# Patient Record
Sex: Male | Born: 1935 | Race: White | Hispanic: No | Smoking: Never smoker
Health system: Southern US, Community
[De-identification: ages and names within clinical notes are randomized; demographics above are authoritative.]

## PROBLEM LIST (undated history)

## (undated) DIAGNOSIS — E119 Type 2 diabetes mellitus without complications: Secondary | ICD-10-CM

## (undated) DIAGNOSIS — H3320 Serous retinal detachment, unspecified eye: Secondary | ICD-10-CM

## (undated) DIAGNOSIS — I214 Non-ST elevation (NSTEMI) myocardial infarction: Secondary | ICD-10-CM

## (undated) DIAGNOSIS — S2239XA Fracture of one rib, unspecified side, initial encounter for closed fracture: Secondary | ICD-10-CM

## (undated) DIAGNOSIS — C801 Malignant (primary) neoplasm, unspecified: Secondary | ICD-10-CM

## (undated) DIAGNOSIS — H269 Unspecified cataract: Secondary | ICD-10-CM

## (undated) DIAGNOSIS — J189 Pneumonia, unspecified organism: Secondary | ICD-10-CM

## (undated) DIAGNOSIS — H409 Unspecified glaucoma: Secondary | ICD-10-CM

## (undated) DIAGNOSIS — I10 Essential (primary) hypertension: Secondary | ICD-10-CM

## (undated) HISTORY — DX: Unspecified cataract: H26.9

## (undated) HISTORY — PX: EYE SURGERY: SHX253

## (undated) HISTORY — DX: Type 2 diabetes mellitus without complications: E11.9

## (undated) HISTORY — PX: JOINT REPLACEMENT: SHX530

## (undated) HISTORY — DX: Malignant (primary) neoplasm, unspecified: C80.1

## (undated) HISTORY — PX: PROSTATE SURGERY: SHX751

## (undated) HISTORY — DX: Unspecified glaucoma: H40.9

## (undated) HISTORY — PX: HERNIA REPAIR: SHX51

---

## 1998-10-04 ENCOUNTER — Other Ambulatory Visit: Admission: RE | Admit: 1998-10-04 | Discharge: 1998-10-04 | Payer: Self-pay | Admitting: Otolaryngology

## 2006-10-22 ENCOUNTER — Ambulatory Visit (HOSPITAL_COMMUNITY): Admission: RE | Admit: 2006-10-22 | Discharge: 2006-10-22 | Payer: Self-pay | Admitting: Ophthalmology

## 2007-07-16 ENCOUNTER — Emergency Department (HOSPITAL_COMMUNITY): Admission: EM | Admit: 2007-07-16 | Discharge: 2007-07-16 | Payer: Self-pay | Admitting: Emergency Medicine

## 2007-07-20 ENCOUNTER — Emergency Department (HOSPITAL_COMMUNITY): Admission: EM | Admit: 2007-07-20 | Discharge: 2007-07-20 | Payer: Self-pay | Admitting: Emergency Medicine

## 2010-04-12 ENCOUNTER — Emergency Department (HOSPITAL_COMMUNITY)
Admission: EM | Admit: 2010-04-12 | Discharge: 2010-04-13 | Payer: Self-pay | Source: Home / Self Care | Admitting: Emergency Medicine

## 2010-04-13 ENCOUNTER — Emergency Department (HOSPITAL_COMMUNITY)
Admission: EM | Admit: 2010-04-13 | Discharge: 2010-04-13 | Payer: Self-pay | Source: Home / Self Care | Admitting: Emergency Medicine

## 2010-04-14 ENCOUNTER — Emergency Department (HOSPITAL_COMMUNITY)
Admission: EM | Admit: 2010-04-14 | Discharge: 2010-04-15 | Payer: Self-pay | Source: Home / Self Care | Admitting: Emergency Medicine

## 2010-06-09 ENCOUNTER — Ambulatory Visit (HOSPITAL_COMMUNITY): Admission: RE | Admit: 2010-06-09 | Discharge: 2010-06-09 | Payer: Self-pay | Admitting: Ophthalmology

## 2010-07-23 ENCOUNTER — Ambulatory Visit (HOSPITAL_COMMUNITY): Admission: RE | Admit: 2010-07-23 | Discharge: 2010-07-23 | Payer: Self-pay | Admitting: Ophthalmology

## 2011-03-08 LAB — BASIC METABOLIC PANEL
BUN: 18 mg/dL (ref 6–23)
Chloride: 106 mEq/L (ref 96–112)
GFR calc non Af Amer: >60 mL/min (ref >60)
Glucose, Bld: 125 mg/dL — ABNORMAL HIGH (ref 70–99)
Potassium: 4.8 mEq/L (ref 3.5–5.1)
Sodium: 139 mEq/L (ref 135–145)

## 2011-03-08 LAB — SURGICAL PCR SCREEN: MRSA, PCR: NEGATIVE

## 2011-03-08 LAB — CBC
HCT: 48.3 % (ref 39.0–52.0)
Hemoglobin: 16.8 g/dL (ref 13.0–17.0)
MCV: 95.1 fL (ref 78.0–100.0)
RDW: 13 % (ref 11.5–15.5)
WBC: 8.1 10*3/uL (ref 4.0–10.5)

## 2011-03-08 LAB — GLUCOSE, CAPILLARY
Glucose-Capillary: 114 mg/dL — ABNORMAL HIGH (ref 70–99)
Glucose-Capillary: 119 mg/dL — ABNORMAL HIGH (ref 70–99)

## 2011-03-10 LAB — BASIC METABOLIC PANEL
BUN: 25 mg/dL — ABNORMAL HIGH (ref 6–23)
Chloride: 100 mEq/L (ref 96–112)
GFR calc non Af Amer: >60 mL/min (ref >60)
Potassium: 4.3 mEq/L (ref 3.5–5.1)
Sodium: 133 mEq/L — ABNORMAL LOW (ref 135–145)

## 2011-03-10 LAB — CBC
HCT: 49.1 % (ref 39.0–52.0)
Hemoglobin: 16.7 g/dL (ref 13.0–17.0)
MCV: 98.6 fL (ref 78.0–100.0)
RBC: 4.98 MIL/uL (ref 4.22–5.81)
WBC: 7.2 10*3/uL (ref 4.0–10.5)

## 2011-03-12 LAB — URINE CULTURE
Colony Count: NO GROWTH
Colony Count: NO GROWTH
Culture: NO GROWTH
Culture: NO GROWTH

## 2011-03-12 LAB — URINALYSIS, ROUTINE W REFLEX MICROSCOPIC
Nitrite: NEGATIVE
Protein, ur: 100 mg/dL — AB
Specific Gravity, Urine: 1.015 (ref 1.005–1.030)
Urobilinogen, UA: 0.2 mg/dL (ref 0.0–1.0)

## 2011-03-12 LAB — URINE MICROSCOPIC-ADD ON

## 2011-03-12 LAB — POCT I-STAT, CHEM 8
BUN: 21 mg/dL (ref 6–23)
Calcium, Ion: 1.1 mmol/L — ABNORMAL LOW (ref 1.12–1.32)
Calcium, Ion: 1.21 mmol/L (ref 1.12–1.32)
Chloride: 98 meq/L (ref 96–112)
Creatinine, Ser: 1 mg/dL (ref 0.4–1.5)
Glucose, Bld: 158 mg/dL — ABNORMAL HIGH (ref 70–99)
Glucose, Bld: 182 mg/dL — ABNORMAL HIGH (ref 70–99)
HCT: 45 % (ref 39.0–52.0)
HCT: 46 % (ref 39.0–52.0)
Hemoglobin: 15.3 g/dL (ref 13.0–17.0)
Hemoglobin: 15.6 g/dL (ref 13.0–17.0)
Potassium: 4 mEq/L (ref 3.5–5.1)
Potassium: 4.6 mEq/L (ref 3.5–5.1)
Sodium: 132 mEq/L — ABNORMAL LOW (ref 135–145)
TCO2: 25 mmol/L (ref 0–100)

## 2011-05-10 NOTE — Op Note (Signed)
Geoffrey Bennett, Geoffrey Bennett                ACCOUNT NO.:  [id number]   MEDICAL RECORD NO.:  [id number]          PATIENT TYPE:  AMB   LOCATION:  SDS                          FACILITY:  [facility]   PHYSICIAN:  Alford Highland. Rankin, M.D.   DATE OF BIRTH:  [birth year]   DATE OF PROCEDURE:  10/22/2006  DATE OF DISCHARGE:  10/22/2006                                 OPERATIVE REPORT   PREOPERATIVE DIAGNOSIS:  Epiretinal membrane, left eye.   POSTOPERATIVE DIAGNOSIS:  Epiretinal membrane, left eye.   PROCEDURE:  Posterior vitrectomy, membrane peel--25-gauge--epiretinal  membrane and internal limiting membrane, left eye.   SURGEON:  Fawn Kirk, MD   ANESTHESIA:  Local retrobulbar with monitored anesthesia control.   INDICATIONS FOR PROCEDURE:  The patient is an 77 year old man who has  significant visual impairment, visual dysfunction on the left on the basis  of epiretinal membrane and macular topographic distortion.  The patient says  this is an attempt to remove the macular epiretinal tissue and allow for the  chance of improving it visually.  The patient understands the risks of  anesthesia including the rare occurrence of death and loss of the eye,  including but not limited to hemorrhage, infection, scarring, need for  further surgery, no change in vision, loss of vision, progressive disease  despite of intervention.  Appropriate consent was obtained.   The patient was taken to the operating room.  In the operating room,  appropriate monitors followed by mild sedation.  Marcaine 0.75% delivered 5  cc retrobulbar followed by additional 5 cc laterally in a fashion of a  modified Darel Hong.  The left periocular region was sterilely prepped and  drape in the usual ophthalmic fashion.  The lid speculum applied.  A 25-  gauge trocar system was used to introduce the infusion.  Superior trocars  were applied.  Core vitrectomy was then begun.  Vitreous skirt was then  trimmed 360 degrees.  A 25-gauge forceps  was then used to engage the  epiretinal membrane, which came off as a single continuous sheet, and a  circumferential tear pattern off essentially the entire posterior pole  within the retinal vascular cage temporarily.  No complications occurred.  The peripheral retina was inspected and found to be free of holes and tears.  Vitreous at the sites of the superior trocars was trimmed so as to prevent  its movement externally.   At this time the superior trocars were removed and the wounds were  watertight and secured.  The infusion systems were removed.  At this time,  subconjunctival Decadron applied.  A sterile patch and Fox shield applied.  The patient taken to the discharge area and discharge home as an outpatient.      Alford Highland Rankin, M.D.  Electronically Signed    GAR/MEDQ  D:  10/22/2006  T:  10/23/2006  Job:  [id number]

## 2011-08-01 ENCOUNTER — Encounter: Payer: Self-pay | Admitting: Thoracic Surgery (Cardiothoracic Vascular Surgery)

## 2012-01-06 DIAGNOSIS — C61 Malignant neoplasm of prostate: Secondary | ICD-10-CM | POA: Diagnosis not present

## 2012-01-09 DIAGNOSIS — R972 Elevated prostate specific antigen [PSA]: Secondary | ICD-10-CM | POA: Diagnosis not present

## 2012-01-09 DIAGNOSIS — N281 Cyst of kidney, acquired: Secondary | ICD-10-CM | POA: Diagnosis not present

## 2012-01-09 DIAGNOSIS — C61 Malignant neoplasm of prostate: Secondary | ICD-10-CM | POA: Diagnosis not present

## 2012-01-09 DIAGNOSIS — R350 Frequency of micturition: Secondary | ICD-10-CM | POA: Diagnosis not present

## 2012-01-16 DIAGNOSIS — E119 Type 2 diabetes mellitus without complications: Secondary | ICD-10-CM | POA: Diagnosis not present

## 2012-01-16 DIAGNOSIS — E7800 Pure hypercholesterolemia, unspecified: Secondary | ICD-10-CM | POA: Diagnosis not present

## 2012-01-21 DIAGNOSIS — E7800 Pure hypercholesterolemia, unspecified: Secondary | ICD-10-CM | POA: Diagnosis not present

## 2012-01-21 DIAGNOSIS — I10 Essential (primary) hypertension: Secondary | ICD-10-CM | POA: Diagnosis not present

## 2012-01-21 DIAGNOSIS — E119 Type 2 diabetes mellitus without complications: Secondary | ICD-10-CM | POA: Diagnosis not present

## 2012-04-14 DIAGNOSIS — H33059 Total retinal detachment, unspecified eye: Secondary | ICD-10-CM | POA: Diagnosis not present

## 2012-04-14 DIAGNOSIS — H3340 Traction detachment of retina, unspecified eye: Secondary | ICD-10-CM | POA: Diagnosis not present

## 2012-04-14 DIAGNOSIS — H4060X0 Glaucoma secondary to drugs, unspecified eye, stage unspecified: Secondary | ICD-10-CM | POA: Diagnosis not present

## 2012-04-14 DIAGNOSIS — H35379 Puckering of macula, unspecified eye: Secondary | ICD-10-CM | POA: Diagnosis not present

## 2012-04-14 DIAGNOSIS — T380X5A Adverse effect of glucocorticoids and synthetic analogues, initial encounter: Secondary | ICD-10-CM | POA: Diagnosis not present

## 2012-04-21 DIAGNOSIS — E119 Type 2 diabetes mellitus without complications: Secondary | ICD-10-CM | POA: Diagnosis not present

## 2012-04-23 DIAGNOSIS — E119 Type 2 diabetes mellitus without complications: Secondary | ICD-10-CM | POA: Diagnosis not present

## 2012-04-23 DIAGNOSIS — E7800 Pure hypercholesterolemia, unspecified: Secondary | ICD-10-CM | POA: Diagnosis not present

## 2012-04-23 DIAGNOSIS — I10 Essential (primary) hypertension: Secondary | ICD-10-CM | POA: Diagnosis not present

## 2012-06-04 ENCOUNTER — Other Ambulatory Visit (HOSPITAL_COMMUNITY): Payer: Self-pay | Admitting: Urology

## 2012-06-04 DIAGNOSIS — N281 Cyst of kidney, acquired: Secondary | ICD-10-CM

## 2012-07-03 ENCOUNTER — Ambulatory Visit (HOSPITAL_COMMUNITY)
Admission: RE | Admit: 2012-07-03 | Discharge: 2012-07-03 | Disposition: A | Discharge status: Home / Self Care | Payer: Medicare Other | Source: Ambulatory Visit | Attending: Urology | Admitting: Urology

## 2012-07-03 DIAGNOSIS — Q619 Cystic kidney disease, unspecified: Secondary | ICD-10-CM | POA: Insufficient documentation

## 2012-07-03 DIAGNOSIS — C61 Malignant neoplasm of prostate: Secondary | ICD-10-CM | POA: Diagnosis not present

## 2012-07-03 DIAGNOSIS — N281 Cyst of kidney, acquired: Secondary | ICD-10-CM | POA: Diagnosis not present

## 2012-07-03 DIAGNOSIS — K8020 Calculus of gallbladder without cholecystitis without obstruction: Secondary | ICD-10-CM | POA: Diagnosis not present

## 2012-07-10 DIAGNOSIS — C61 Malignant neoplasm of prostate: Secondary | ICD-10-CM | POA: Diagnosis not present

## 2012-07-10 DIAGNOSIS — N281 Cyst of kidney, acquired: Secondary | ICD-10-CM | POA: Diagnosis not present

## 2012-07-21 DIAGNOSIS — E119 Type 2 diabetes mellitus without complications: Secondary | ICD-10-CM | POA: Diagnosis not present

## 2012-07-21 DIAGNOSIS — E7800 Pure hypercholesterolemia, unspecified: Secondary | ICD-10-CM | POA: Diagnosis not present

## 2012-07-27 DIAGNOSIS — I10 Essential (primary) hypertension: Secondary | ICD-10-CM | POA: Diagnosis not present

## 2012-07-27 DIAGNOSIS — E7800 Pure hypercholesterolemia, unspecified: Secondary | ICD-10-CM | POA: Diagnosis not present

## 2012-07-27 DIAGNOSIS — E119 Type 2 diabetes mellitus without complications: Secondary | ICD-10-CM | POA: Diagnosis not present

## 2012-08-18 ENCOUNTER — Ambulatory Visit (INDEPENDENT_AMBULATORY_CARE_PROVIDER_SITE_OTHER): Payer: Medicare Other | Admitting: Family Medicine

## 2012-08-18 VITALS — BP 126/70 | HR 75 | Temp 98.5°F | Resp 18 | Ht 72.0 in | Wt 197.0 lb

## 2012-08-18 DIAGNOSIS — R490 Dysphonia: Secondary | ICD-10-CM | POA: Diagnosis not present

## 2012-08-18 DIAGNOSIS — H6120 Impacted cerumen, unspecified ear: Secondary | ICD-10-CM | POA: Diagnosis not present

## 2012-08-18 MED ORDER — FLUTICASONE PROPIONATE 50 MCG/ACT NA SUSP: 2.0000 | Freq: Every day | NASAL | Status: DC

## 2012-08-18 NOTE — Progress Notes (Signed)
[facility] [address] [place] [address] [phone]  Date:  08/18/2012   Name:  Geoffrey Bennett   DOB:  [birth year]   MRN:  [id number]  PCP:  Londell Moh, MD    Chief Complaint: Sore Throat and wax removable   History of Present Illness:  Geoffrey Bennett is a 78 y.o. very pleasant male patient who presents with the following:  He usually needs his ear cerumen removed about once a year- he has noted increased HOH and thinks the wax is back.  He would like this addressed today.   He also had noted a hoarse voice "from time to time."  He was seen here for this same issue about one year ago.   The hoarseness occurs about every 4 or 5 months, and may last for a few days.  He does not really feel that his throat is sore- he feels that he "cannot distinguish it from normal."   He has never been a smoker- smoked just a few cigarettes as a boy.   He may have been given some medication for this in the past but he is not sure from where.  He reports that an ENT doctor has looked at his throat in the past but he is not sure.    History of prostate cancer and DM.  History of radioactive iodine seed implant for his prostate cancer- he is followed regularly by his PCP Dr. Renne Crigler and his urologist Dr. Clydene Pugh.    There is no problem list on file for this patient.   No past medical history on file.  No past surgical history on file.  History  Substance Use Topics  . Smoking status: Never Smoker   . Smokeless tobacco: Not on file  . Alcohol Use: No    No family history on file.  No Known Allergies  Medication list has been reviewed and updated.  Current Outpatient Prescriptions on File Prior to Visit  Medication Sig Dispense Refill  . atorvastatin (LIPITOR) 20 MG tablet Take 20 mg by mouth daily.      . furosemide (LASIX) 10 MG/ML solution Take 20 mg/kg by mouth daily.      Marland Kitchen omega-3 acid ethyl esters (LOVAZA) 1 G capsule Take 2 g by mouth 2 (two)  times daily.      . ramipril (ALTACE) 5 MG capsule Take 5 mg by mouth daily.      . Saxagliptin-Metformin (KOMBIGLYZE XR) 04-999 MG TB24 Take by mouth once.        Review of Systems:  As per HPI- otherwise negative.   Physical Examination: Filed Vitals:   08/18/12 0845  BP: 126/70  Pulse: 75  Temp: 98.5 F (36.9 C)  Resp: 18   Filed Vitals:   08/18/12 0845  Height: 6' (1.829 m)  Weight: 197 lb (89.359 kg)   Body mass index is 26.72 kg/(m^2). Ideal Body Weight: Weight in (lb) to have BMI = 25: 183.9   GEN: WDWN, NAD, Non-toxic, A & O x 3 HEENT: Atraumatic, Normocephalic. Neck supple. No masses, No LAD.  PEERL, EOMI, bilateral ear canals with cerumen build-up.  Oropharynx wnl, no redness, exudate or swelling.   Ears and Nose: No external deformity. CV: RRR, No M/G/R. No JVD. No thrill. No extra heart sounds. PULM: CTA B, no wheezes, crackles, rhonchi. No retractions. No resp. distress. No accessory muscle use. EXTR: No c/c/e NEURO Normal gait.  PSYCH: Normally interactive. Conversant. Not depressed or  anxious appearing.  Calm demeanor.   Ears irrigated- all cerumen removed.  TM wnl bilaterally, ear canals looks great.  He felt better.     Assessment and Plan: 1. Cerumen impaction    2. Hoarseness of voice  fluticasone (FLONASE) 50 MCG/ACT nasal spray   Cerumen impaction resolved.  Letter sent to PCP regarding ENT evaluation.  Will try flonase for voice hoarseness- he will let me know if not helpful  Catheline Hixon, MD

## 2012-09-01 DIAGNOSIS — R490 Dysphonia: Secondary | ICD-10-CM | POA: Diagnosis not present

## 2012-10-13 DIAGNOSIS — H31009 Unspecified chorioretinal scars, unspecified eye: Secondary | ICD-10-CM | POA: Diagnosis not present

## 2012-10-13 DIAGNOSIS — H3340 Traction detachment of retina, unspecified eye: Secondary | ICD-10-CM | POA: Diagnosis not present

## 2012-10-13 DIAGNOSIS — H35359 Cystoid macular degeneration, unspecified eye: Secondary | ICD-10-CM | POA: Diagnosis not present

## 2012-10-13 DIAGNOSIS — H35379 Puckering of macula, unspecified eye: Secondary | ICD-10-CM | POA: Diagnosis not present

## 2012-11-25 DIAGNOSIS — E7800 Pure hypercholesterolemia, unspecified: Secondary | ICD-10-CM | POA: Diagnosis not present

## 2012-11-25 DIAGNOSIS — E119 Type 2 diabetes mellitus without complications: Secondary | ICD-10-CM | POA: Diagnosis not present

## 2012-11-26 DIAGNOSIS — B9789 Other viral agents as the cause of diseases classified elsewhere: Secondary | ICD-10-CM | POA: Diagnosis not present

## 2012-11-26 DIAGNOSIS — R6889 Other general symptoms and signs: Secondary | ICD-10-CM | POA: Diagnosis not present

## 2012-12-04 ENCOUNTER — Telehealth: Payer: Self-pay | Admitting: *Deleted

## 2012-12-04 NOTE — Telephone Encounter (Signed)
A user error has taken place: wrong patient

## 2012-12-14 DIAGNOSIS — H31009 Unspecified chorioretinal scars, unspecified eye: Secondary | ICD-10-CM | POA: Diagnosis not present

## 2012-12-14 DIAGNOSIS — H33309 Unspecified retinal break, unspecified eye: Secondary | ICD-10-CM | POA: Diagnosis not present

## 2012-12-31 DIAGNOSIS — I10 Essential (primary) hypertension: Secondary | ICD-10-CM | POA: Diagnosis not present

## 2012-12-31 DIAGNOSIS — E119 Type 2 diabetes mellitus without complications: Secondary | ICD-10-CM | POA: Diagnosis not present

## 2012-12-31 DIAGNOSIS — R29818 Other symptoms and signs involving the nervous system: Secondary | ICD-10-CM | POA: Diagnosis not present

## 2012-12-31 DIAGNOSIS — E7800 Pure hypercholesterolemia, unspecified: Secondary | ICD-10-CM | POA: Diagnosis not present

## 2013-01-08 DIAGNOSIS — C61 Malignant neoplasm of prostate: Secondary | ICD-10-CM | POA: Diagnosis not present

## 2013-02-15 DIAGNOSIS — H4060X0 Glaucoma secondary to drugs, unspecified eye, stage unspecified: Secondary | ICD-10-CM | POA: Diagnosis not present

## 2013-02-15 DIAGNOSIS — T380X5A Adverse effect of glucocorticoids and synthetic analogues, initial encounter: Secondary | ICD-10-CM | POA: Diagnosis not present

## 2013-02-15 DIAGNOSIS — H2010 Chronic iridocyclitis, unspecified eye: Secondary | ICD-10-CM | POA: Diagnosis not present

## 2013-02-15 DIAGNOSIS — H35359 Cystoid macular degeneration, unspecified eye: Secondary | ICD-10-CM | POA: Diagnosis not present

## 2013-03-02 DIAGNOSIS — H35359 Cystoid macular degeneration, unspecified eye: Secondary | ICD-10-CM | POA: Diagnosis not present

## 2013-03-02 DIAGNOSIS — H4060X0 Glaucoma secondary to drugs, unspecified eye, stage unspecified: Secondary | ICD-10-CM | POA: Diagnosis not present

## 2013-03-02 DIAGNOSIS — H2010 Chronic iridocyclitis, unspecified eye: Secondary | ICD-10-CM | POA: Diagnosis not present

## 2013-03-02 DIAGNOSIS — T380X5A Adverse effect of glucocorticoids and synthetic analogues, initial encounter: Secondary | ICD-10-CM | POA: Diagnosis not present

## 2013-03-22 ENCOUNTER — Ambulatory Visit (INDEPENDENT_AMBULATORY_CARE_PROVIDER_SITE_OTHER): Payer: Medicare Other | Admitting: Internal Medicine

## 2013-03-22 VITALS — BP 152/69 | HR 76 | Temp 97.5°F | Resp 16 | Ht 71.75 in | Wt 192.6 lb

## 2013-03-22 DIAGNOSIS — E119 Type 2 diabetes mellitus without complications: Secondary | ICD-10-CM | POA: Insufficient documentation

## 2013-03-22 DIAGNOSIS — N400 Enlarged prostate without lower urinary tract symptoms: Secondary | ICD-10-CM | POA: Insufficient documentation

## 2013-03-22 DIAGNOSIS — H9190 Unspecified hearing loss, unspecified ear: Secondary | ICD-10-CM | POA: Insufficient documentation

## 2013-03-22 DIAGNOSIS — I10 Essential (primary) hypertension: Secondary | ICD-10-CM | POA: Insufficient documentation

## 2013-03-22 DIAGNOSIS — H9193 Unspecified hearing loss, bilateral: Secondary | ICD-10-CM

## 2013-03-22 NOTE — Progress Notes (Signed)
  Subjective:    Patient ID: Geoffrey Bennett, male    DOB: Oct 26, 1922, 79 y.o.   MRN: [id number]  HPI 79 year old concerned with potential hearing loss Worries about cerumen impaction which he's had many times and required many irrigations No otalgia No change in meds  Problems include diabetes, hypertension   Review of Systems Noncontributory    Objective:   Physical Exam BP 152/69  Pulse 76  Temp(Src) 97.5 F (36.4 C) (Oral)  Resp 16  Ht 5' 11.75" (1.822 m)  Wt 192 lb 9.6 oz (87.363 kg)  BMI 26.32 kg/m2  SpO2 96% Canals clear Tympanic membranes intact  Audiogram= there were no decibel levels at which he could clearly hear the signal in either ear       Assessment & Plan:  Problem 1 hearing loss  Referred for evaluation for appliances

## 2013-03-22 NOTE — Patient Instructions (Addendum)
1. Local business results for hearing aids in [place] near [place], [place]   [email or web address] - [phone] -    [facility] [email or web address] - [phone] -    [facility] [email or web address] - [phone] -    [employer] [email or web address] - [phone] -    [facility] [email or web address] - [phone] -   [facility], [facility] [place], [place] ... [email or web address]  ?

## 2013-03-30 DIAGNOSIS — E119 Type 2 diabetes mellitus without complications: Secondary | ICD-10-CM | POA: Diagnosis not present

## 2013-03-30 DIAGNOSIS — E7800 Pure hypercholesterolemia, unspecified: Secondary | ICD-10-CM | POA: Diagnosis not present

## 2013-04-01 DIAGNOSIS — E7800 Pure hypercholesterolemia, unspecified: Secondary | ICD-10-CM | POA: Diagnosis not present

## 2013-04-01 DIAGNOSIS — I10 Essential (primary) hypertension: Secondary | ICD-10-CM | POA: Diagnosis not present

## 2013-04-01 DIAGNOSIS — E119 Type 2 diabetes mellitus without complications: Secondary | ICD-10-CM | POA: Diagnosis not present

## 2013-04-13 DIAGNOSIS — H35359 Cystoid macular degeneration, unspecified eye: Secondary | ICD-10-CM | POA: Diagnosis not present

## 2013-04-27 ENCOUNTER — Encounter (HOSPITAL_COMMUNITY): Payer: Self-pay | Admitting: *Deleted

## 2013-04-27 ENCOUNTER — Emergency Department (HOSPITAL_COMMUNITY): Payer: Medicare Other

## 2013-04-27 ENCOUNTER — Emergency Department (HOSPITAL_COMMUNITY)
Admission: EM | Admit: 2013-04-27 | Discharge: 2013-04-27 | Disposition: A | Discharge status: Home / Self Care | Payer: Medicare Other | Attending: Emergency Medicine | Admitting: Emergency Medicine

## 2013-04-27 DIAGNOSIS — W19XXXA Unspecified fall, initial encounter: Secondary | ICD-10-CM

## 2013-04-27 DIAGNOSIS — H5710 Ocular pain, unspecified eye: Secondary | ICD-10-CM | POA: Diagnosis not present

## 2013-04-27 DIAGNOSIS — Z79899 Other long term (current) drug therapy: Secondary | ICD-10-CM | POA: Diagnosis not present

## 2013-04-27 DIAGNOSIS — C801 Malignant (primary) neoplasm, unspecified: Secondary | ICD-10-CM | POA: Diagnosis not present

## 2013-04-27 DIAGNOSIS — E119 Type 2 diabetes mellitus without complications: Secondary | ICD-10-CM | POA: Insufficient documentation

## 2013-04-27 DIAGNOSIS — I10 Essential (primary) hypertension: Secondary | ICD-10-CM | POA: Diagnosis not present

## 2013-04-27 DIAGNOSIS — S61011A Laceration without foreign body of right thumb without damage to nail, initial encounter: Secondary | ICD-10-CM

## 2013-04-27 DIAGNOSIS — IMO0002 Reserved for concepts with insufficient information to code with codable children: Secondary | ICD-10-CM | POA: Insufficient documentation

## 2013-04-27 DIAGNOSIS — T1490XA Injury, unspecified, initial encounter: Secondary | ICD-10-CM | POA: Diagnosis not present

## 2013-04-27 DIAGNOSIS — H409 Unspecified glaucoma: Secondary | ICD-10-CM | POA: Insufficient documentation

## 2013-04-27 DIAGNOSIS — Y929 Unspecified place or not applicable: Secondary | ICD-10-CM | POA: Insufficient documentation

## 2013-04-27 DIAGNOSIS — Z8669 Personal history of other diseases of the nervous system and sense organs: Secondary | ICD-10-CM | POA: Diagnosis not present

## 2013-04-27 DIAGNOSIS — Z7982 Long term (current) use of aspirin: Secondary | ICD-10-CM | POA: Insufficient documentation

## 2013-04-27 DIAGNOSIS — H269 Unspecified cataract: Secondary | ICD-10-CM | POA: Diagnosis not present

## 2013-04-27 DIAGNOSIS — S61209A Unspecified open wound of unspecified finger without damage to nail, initial encounter: Secondary | ICD-10-CM | POA: Diagnosis not present

## 2013-04-27 DIAGNOSIS — S0993XA Unspecified injury of face, initial encounter: Secondary | ICD-10-CM | POA: Diagnosis not present

## 2013-04-27 DIAGNOSIS — S0181XA Laceration without foreign body of other part of head, initial encounter: Secondary | ICD-10-CM

## 2013-04-27 DIAGNOSIS — S0990XA Unspecified injury of head, initial encounter: Secondary | ICD-10-CM | POA: Diagnosis not present

## 2013-04-27 DIAGNOSIS — S0180XA Unspecified open wound of other part of head, initial encounter: Secondary | ICD-10-CM | POA: Diagnosis not present

## 2013-04-27 DIAGNOSIS — S199XXA Unspecified injury of neck, initial encounter: Secondary | ICD-10-CM | POA: Diagnosis not present

## 2013-04-27 DIAGNOSIS — S0100XA Unspecified open wound of scalp, initial encounter: Secondary | ICD-10-CM | POA: Diagnosis not present

## 2013-04-27 DIAGNOSIS — Y9389 Activity, other specified: Secondary | ICD-10-CM | POA: Insufficient documentation

## 2013-04-27 DIAGNOSIS — W010XXA Fall on same level from slipping, tripping and stumbling without subsequent striking against object, initial encounter: Secondary | ICD-10-CM | POA: Insufficient documentation

## 2013-04-27 HISTORY — DX: Essential (primary) hypertension: I10

## 2013-04-27 HISTORY — DX: Serous retinal detachment, unspecified eye: H33.20

## 2013-04-27 NOTE — ED Notes (Signed)
Pt cleared off back board.

## 2013-04-27 NOTE — ED Notes (Signed)
AVW:[id number]<XB> Expected date:04/27/13<BR> Expected time:<BR> Means of arrival:<BR> Comments:<BR> EMS 40M, fall, lower back pain, rt thigh and left hip pain

## 2013-04-27 NOTE — ED Notes (Signed)
Per EMS pt fell outside of a gym and has a laceration over his left eye and skin tear to his left thumb and a contusion under his left eye  Denies LOC

## 2013-04-27 NOTE — ED Provider Notes (Signed)
History     CSN: [id number]  Arrival date & time 04/27/13  4098   First MD Initiated Contact with Patient 04/27/13 [phone]      Chief Complaint  Patient presents with  . Fall    (Consider location/radiation/quality/duration/timing/severity/associated sxs/prior treatment) Patient is a 79 y.o. male presenting with fall. The history is provided by the patient.  Fall The accident occurred less than 1 hour ago. Pertinent negatives include no fever, no abdominal pain, no nausea and no headaches. Associated symptoms comments: He tripped while walking, falling to cement hitting his head causing left eyebrow laceration. He reports "I think I lost consciousness". No neck, chest or abdominal pain. He is ambulatory without discomfort. .    Past Medical History  Diagnosis Date  . Cancer   . Cataract   . Diabetes mellitus without complication   . Glaucoma   . Hypertension   . 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 Eye surgery    . Hernia repair    . Joint replacement    . Prostate surgery      Family History  Problem Relation Age of Onset  . Heart disease Mother   . Cancer Father     History  Substance Use Topics  . Smoking status: Never Smoker   . Smokeless tobacco: Not on file  . Alcohol Use: No      Review of Systems  Constitutional: Negative for fever.  HENT: Negative for nosebleeds, mouth sores, neck pain and dental problem.   Respiratory: Negative for shortness of breath.   Cardiovascular: Negative for chest pain.  Gastrointestinal: Negative for nausea and abdominal pain.  Skin: Positive for wound.  Neurological: Negative for speech difficulty and headaches.  Psychiatric/Behavioral: Negative for confusion.    Allergies  Review of patient's allergies indicates no known allergies.  Home Medications   Current Outpatient Rx  Name  Route  Sig  Dispense  Refill  . aspirin 81 MG tablet   Oral    Take 81 mg by mouth daily.         Marland Kitchen atorvastatin (LIPITOR) 20 MG tablet   Oral   Take 20 mg by mouth daily.         . cholecalciferol (VITAMIN D) 1000 UNITS tablet   Oral   Take 1,000 Units by mouth daily.         Marland Kitchen dutasteride (AVODART) 0.5 MG capsule   Oral   Take 0.5 mg by mouth daily.         . fish oil-omega-3 fatty acids 1000 MG capsule   Oral   Take 2 g by mouth daily.         . furosemide (LASIX) 10 MG/ML solution   Oral   Take 20 mg/kg by mouth daily.         Marland Kitchen omega-3 acid ethyl esters (LOVAZA) 1 G capsule   Oral   Take 2 g by mouth 2 (two) times daily.         . ramipril (ALTACE) 5 MG capsule   Oral   Take 5 mg by mouth daily.         . Saxagliptin-Metformin (KOMBIGLYZE XR) 04-999 MG TB24   Oral   Take 1 tablet by mouth daily.            BP 150/74  Pulse 70  Temp(Src) 98.1 F (36.7 C) (Oral)  Resp  19  SpO2 95%  Physical Exam  Constitutional: He is oriented to person, place, and time. He appears well-developed and well-nourished.  HENT:  Head: Normocephalic.  Eyes: Conjunctivae are normal. Pupils are equal, round, and reactive to light.  No subconjunctival hemorrhage or hyphema. Full, painfree ROM extraocular muscle.   Neck: Normal range of motion. Neck supple.  Cardiovascular: Normal rate and regular rhythm.   Pulmonary/Chest: Effort normal and breath sounds normal.  Abdominal: Soft. Bowel sounds are normal. There is no tenderness. There is no rebound and no guarding.  Musculoskeletal: Normal range of motion.  No cervical tenderness.   Neurological: He is alert and oriented to person, place, and time.  Skin: Skin is warm and dry. No rash noted.  4 cm laceration to left medial eyebrow, full thickness on medial 1/2 of wound. Periorbital ecchymosis with mild swelling only.   Psychiatric: He has a normal mood and affect.    ED Course  Procedures (including critical care time)  Labs Reviewed - No data to display No results  found. Ct Head Wo Contrast  04/27/2013  *RADIOLOGY REPORT*  Clinical Data:  Larey Seat.  Hit head.  CT HEAD WITHOUT CONTRAST CT MAXILLOFACIAL WITHOUT CONTRAST  Technique:  Multidetector CT imaging of the head and maxillofacial structures were performed using the standard protocol without intravenous contrast. Multiplanar CT image reconstructions of the maxillofacial structures were also generated.  Comparison:  None  CT HEAD  Findings: There is a large left frontal scalp hematoma and laceration without radiopaque foreign body.  No underlying skull fracture.  The ventricles are normal.  No extra-axial fluid collections.  No acute intracranial findings or mass lesion.  The brainstem and cerebellum are normal for age.  The bony structures are intact.  The paranasal sinuses and mastoid air cells are clear.  IMPRESSION:  No acute intracranial findings, mass lesion or skull fracture.  CT MAXILLOFACIAL  Findings:   No acute facial bone fractures are identified.  Minimal scattered maxillary and ethmoid sinus disease.  The mandibular condyles are normally located.  No mandible fracture.  The globes are intact.  No orbital fractures.  Moderate.  Apical lucency noted around the right mandibular molar.  IMPRESSION: No acute facial bone fractures.   Original Report Authenticated By: Rudie Meyer, M.D.    Ct Maxillofacial Wo Cm  04/27/2013  *RADIOLOGY REPORT*  Clinical Data:  Larey Seat.  Hit head.  CT HEAD WITHOUT CONTRAST CT MAXILLOFACIAL WITHOUT CONTRAST  Technique:  Multidetector CT imaging of the head and maxillofacial structures were performed using the standard protocol without intravenous contrast. Multiplanar CT image reconstructions of the maxillofacial structures were also generated.  Comparison:  None  CT HEAD  Findings: There is a large left frontal scalp hematoma and laceration without radiopaque foreign body.  No underlying skull fracture.  The ventricles are normal.  No extra-axial fluid collections.  No acute intracranial  findings or mass lesion.  The brainstem and cerebellum are normal for age.  The bony structures are intact.  The paranasal sinuses and mastoid air cells are clear.  IMPRESSION:  No acute intracranial findings, mass lesion or skull fracture.  CT MAXILLOFACIAL  Findings:   No acute facial bone fractures are identified.  Minimal scattered maxillary and ethmoid sinus disease.  The mandibular condyles are normally located.  No mandible fracture.  The globes are intact.  No orbital fractures.  Moderate.  Apical lucency noted around the right mandibular molar.  IMPRESSION: No acute facial bone fractures.   Original Report  Authenticated By: Rudie Meyer, M.D.     No diagnosis found.  1. Fall 2. Facial laceration 3. Abrasion thumb   MDM  LACERATION REPAIR Performed by: Elpidio Anis A Authorized by: Elpidio Anis A Consent: Verbal consent obtained. Risks and benefits: risks, benefits and alternatives were discussed Consent given by: patient Patient identity confirmed: provided demographic data Prepped and Draped in normal sterile fashion Wound explored  Laceration Location: right eyebrow  Laceration Length: 4cm  No Foreign Bodies seen or palpated  Anesthesia: local infiltration  Local anesthetic: lidocaine 1% w/ epinephrine  Anesthetic total: 1.5 ml  Irrigation method: syringe Amount of cleaning: standard  Skin closure: 6-0 prolene  Number of sutures: 7  Technique: running  Patient tolerance: Patient tolerated the procedure well with no immediate complications.         Arnoldo Hooker, PA-C 04/27/13 [phone]

## 2013-04-27 NOTE — ED Notes (Signed)
PA at bedside.

## 2013-04-27 NOTE — ED Notes (Signed)
Patient transported to CT 

## 2013-04-28 NOTE — ED Provider Notes (Signed)
Medical screening examination/treatment/procedure(s) were performed by non-physician practitioner and as supervising physician I was immediately available for consultation/collaboration.  Quintana Canelo T Bria Sparr, MD 04/28/13 0312 

## 2013-05-03 DIAGNOSIS — T148XXA Other injury of unspecified body region, initial encounter: Secondary | ICD-10-CM | POA: Diagnosis not present

## 2013-05-08 ENCOUNTER — Ambulatory Visit: Payer: Medicare Other

## 2013-05-08 ENCOUNTER — Ambulatory Visit (INDEPENDENT_AMBULATORY_CARE_PROVIDER_SITE_OTHER): Payer: Medicare Other | Admitting: Emergency Medicine

## 2013-05-08 VITALS — BP 132/82 | HR 94 | Temp 98.8°F | Resp 17 | Ht 73.0 in | Wt 189.0 lb

## 2013-05-08 DIAGNOSIS — S0510XA Contusion of eyeball and orbital tissues, unspecified eye, initial encounter: Secondary | ICD-10-CM | POA: Diagnosis not present

## 2013-05-08 DIAGNOSIS — M542 Cervicalgia: Secondary | ICD-10-CM | POA: Diagnosis not present

## 2013-05-08 DIAGNOSIS — Z23 Encounter for immunization: Secondary | ICD-10-CM

## 2013-05-08 DIAGNOSIS — S0542XA Penetrating wound of orbit with or without foreign body, left eye, initial encounter: Secondary | ICD-10-CM

## 2013-05-08 NOTE — Progress Notes (Signed)
  Subjective:    Patient ID: Geoffrey Bennett, male    DOB: 11-Jan-1922, 79 y.o.   MRN: [id number]  HPI  79 year old hit fell and hit face on sidewalk last Tuesday.  Went to [facility] and received 12 stitches over left eye and did an MRI.  Was told to get a tdap.  Had last tdap in 2009.  Neck is real stiff.  Neck didn't start getting stiff until yesterday.  Patient would like to get a tdap even though his is still good.      Review of Systems     Objective:   Physical Exam the wound over the left thigh is healed. Examination of the neck reveals limited flexion and extension to where he can only flex and extend approximately 15-20. He also has very limited twisting to the right and to the left. His deep tendon reflexes were trace to 1+. He did not have any upper extremity weakness. His chest was clear to auscultation and percussion. His cardiac exam revealed an irregular rhythm which seemed to vary with respiration   [facility] reading (PRIMARY) by  Dr.Daub patient has multilevel degenerative changes which start at C4 down to C7. I did not see an acute fracture or soft tissue swelling.        Assessment & Plan:  We'll check his EKG due to very irregular heart rhythm. Films will be done of his neck. It has been 5 years since his last tetanus shot and this has been updated. I told the patient I would call him this afternoon if the radiologist noted any abnormalities on his x-rays .

## 2013-06-04 DIAGNOSIS — K140 Glossitis: Secondary | ICD-10-CM | POA: Diagnosis not present

## 2013-06-15 DIAGNOSIS — H3340 Traction detachment of retina, unspecified eye: Secondary | ICD-10-CM | POA: Diagnosis not present

## 2013-06-15 DIAGNOSIS — H4011X0 Primary open-angle glaucoma, stage unspecified: Secondary | ICD-10-CM | POA: Diagnosis not present

## 2013-06-15 DIAGNOSIS — H31009 Unspecified chorioretinal scars, unspecified eye: Secondary | ICD-10-CM | POA: Diagnosis not present

## 2013-06-15 DIAGNOSIS — H35379 Puckering of macula, unspecified eye: Secondary | ICD-10-CM | POA: Diagnosis not present

## 2013-06-15 DIAGNOSIS — H2010 Chronic iridocyclitis, unspecified eye: Secondary | ICD-10-CM | POA: Diagnosis not present

## 2013-06-17 DIAGNOSIS — K140 Glossitis: Secondary | ICD-10-CM | POA: Diagnosis not present

## 2013-06-29 DIAGNOSIS — E7800 Pure hypercholesterolemia, unspecified: Secondary | ICD-10-CM | POA: Diagnosis not present

## 2013-06-29 DIAGNOSIS — E119 Type 2 diabetes mellitus without complications: Secondary | ICD-10-CM | POA: Diagnosis not present

## 2013-07-01 DIAGNOSIS — E119 Type 2 diabetes mellitus without complications: Secondary | ICD-10-CM | POA: Diagnosis not present

## 2013-07-01 DIAGNOSIS — I10 Essential (primary) hypertension: Secondary | ICD-10-CM | POA: Diagnosis not present

## 2013-07-01 DIAGNOSIS — Z006 Encounter for examination for normal comparison and control in clinical research program: Secondary | ICD-10-CM | POA: Diagnosis not present

## 2013-07-01 DIAGNOSIS — E7800 Pure hypercholesterolemia, unspecified: Secondary | ICD-10-CM | POA: Diagnosis not present

## 2013-07-09 DIAGNOSIS — C61 Malignant neoplasm of prostate: Secondary | ICD-10-CM | POA: Diagnosis not present

## 2013-07-14 DIAGNOSIS — I10 Essential (primary) hypertension: Secondary | ICD-10-CM | POA: Diagnosis not present

## 2013-07-16 DIAGNOSIS — C61 Malignant neoplasm of prostate: Secondary | ICD-10-CM | POA: Diagnosis not present

## 2013-07-16 DIAGNOSIS — R350 Frequency of micturition: Secondary | ICD-10-CM | POA: Diagnosis not present

## 2013-07-19 DIAGNOSIS — Z0000 Encounter for general adult medical examination without abnormal findings: Secondary | ICD-10-CM | POA: Diagnosis not present

## 2013-07-21 DIAGNOSIS — Z0000 Encounter for general adult medical examination without abnormal findings: Secondary | ICD-10-CM | POA: Diagnosis not present

## 2013-07-21 DIAGNOSIS — I4891 Unspecified atrial fibrillation: Secondary | ICD-10-CM | POA: Diagnosis not present

## 2013-07-21 DIAGNOSIS — E119 Type 2 diabetes mellitus without complications: Secondary | ICD-10-CM | POA: Diagnosis not present

## 2013-07-21 DIAGNOSIS — E7800 Pure hypercholesterolemia, unspecified: Secondary | ICD-10-CM | POA: Diagnosis not present

## 2013-07-21 DIAGNOSIS — I10 Essential (primary) hypertension: Secondary | ICD-10-CM | POA: Diagnosis not present

## 2013-07-28 ENCOUNTER — Ambulatory Visit (INDEPENDENT_AMBULATORY_CARE_PROVIDER_SITE_OTHER): Payer: Medicare Other | Admitting: Cardiovascular Disease

## 2013-07-28 ENCOUNTER — Encounter: Payer: Self-pay | Admitting: Cardiovascular Disease

## 2013-07-28 VITALS — BP 116/78 | HR 64 | Resp 16 | Ht 72.0 in | Wt 192.0 lb

## 2013-07-28 DIAGNOSIS — R011 Cardiac murmur, unspecified: Secondary | ICD-10-CM | POA: Diagnosis not present

## 2013-07-28 DIAGNOSIS — I498 Other specified cardiac arrhythmias: Secondary | ICD-10-CM

## 2013-07-28 DIAGNOSIS — I4891 Unspecified atrial fibrillation: Secondary | ICD-10-CM

## 2013-07-28 DIAGNOSIS — I499 Cardiac arrhythmia, unspecified: Secondary | ICD-10-CM

## 2013-07-28 DIAGNOSIS — E119 Type 2 diabetes mellitus without complications: Secondary | ICD-10-CM | POA: Diagnosis not present

## 2013-07-28 DIAGNOSIS — E785 Hyperlipidemia, unspecified: Secondary | ICD-10-CM | POA: Insufficient documentation

## 2013-07-28 DIAGNOSIS — I10 Essential (primary) hypertension: Secondary | ICD-10-CM

## 2013-07-28 NOTE — Assessment & Plan Note (Signed)
It sounds like he has at least mild, probably moderate aortic insufficiency. There are no peripheral signs of severe aortic insufficiency. His heart size by clinical exam appears to be normal. I recommend that he have an echocardiogram to clarify this diagnosis. He is approaching age 79 and is asymptomatic from a cardiac standpoint. Even if we find severe aortic insufficiency, I think we will be hard pressed to recommend aggressive therapy as long as he remains asymptomatic.

## 2013-07-28 NOTE — Assessment & Plan Note (Addendum)
Today's electrocardiogram clearly shows background sinus rhythm with very frequent PACs and an atrial couplet. I believe the same rhythm is present on the electrocardiogram performed earlier. There appears to be longer spell of accelerated atrial rhythm on that tracing. However I believe there are very distinct P waves seen. At this point I do not recommend that he receive anticoagulants. His atrial arrhythmia may be a harbinger of occasional paroxysmal atrial fibrillation. He will wear 14 day event monitor. Continue taking aspirin daily. If atrial fibrillation is confirmed his advanced age, hypertension and presence of diabetes mellitus would lead to recommendation for full anticoagulation.

## 2013-07-28 NOTE — Assessment & Plan Note (Signed)
Controlled. If he does indeed have aortic insufficiency , the angiotensin receptor blocker that he already is taking is quite appropriate.

## 2013-07-28 NOTE — Assessment & Plan Note (Signed)
On statin.

## 2013-07-28 NOTE — Patient Instructions (Signed)
Your physician has requested that you have an echocardiogram. Echocardiography is a painless test that uses sound waves to create images of your heart. It provides your doctor with information about the size and shape of your heart and how well your heart's chambers and valves are working. This procedure takes approximately one hour. There are no restrictions for this procedure.  Your physician has recommended that you wear an event monitor. Event monitors are medical devices that record the heart's electrical activity. Doctors most often [place] these monitors to diagnose arrhythmias. Arrhythmias are problems with the speed or rhythm of the heartbeat. The monitor is a small, portable device. You can wear one while you do your normal daily activities. This is usually used to diagnose what is causing palpitations/syncope (passing out).  Your physician recommends that you schedule a follow-up appointment in: 3-4 weeks

## 2013-07-28 NOTE — Progress Notes (Signed)
Patient ID: Geoffrey Bennett, male   DOB: [birth year], 79 y.o.   MRN: [id number]     Reason for office visit Suspected atrial fibrillation  Geoffrey Bennett was seen recently for a routine physical exam and found to have an irregular heart rhythm. His electrocardiogram was interpreted by the computer as showing atrial fibrillation and he is referred for evaluation. He is completely unaware of any arrhythmia although clearly his heart rate is irregular today as well. He denies shortness of breath or chest pain and he exercises at the gym 5 days a week. He lives alone and takes care of his household independently, with some assistance from his daughter.  Both electrocardiograms available for review (the one that's generated this referral and the one performed today) show sinus rhythm with frequent atrial ectopy. Neither one shows atrial fibrillation my interpretation. There are distinct P waves seen before each QRS with a one to one association. Some of the pubis a different morphology consistent with ectopic origin appears to be a brief episode of accelerated atrial rhythm on the EKG performed in Dr. Carolee Rota office.   At age 68 when volunteering for [employer] duty he was told on a couple occasions that he had a heart murmur, but on the last evaluation he was told that he would "outgrow it". As far as I can tell he has never had a formal evaluation for the murmur.    No Known Allergies  Current Outpatient Prescriptions  Medication Sig Dispense Refill  . aspirin 81 MG tablet Take 81 mg by mouth daily.      Marland Kitchen atorvastatin (LIPITOR) 20 MG tablet Take 20 mg by mouth daily.      . cholecalciferol (VITAMIN D) 1000 UNITS tablet Take 1,000 Units by mouth daily.      . fish oil-omega-3 fatty acids 1000 MG capsule Take 2 g by mouth daily.      . furosemide (LASIX) 20 MG tablet Take 20 mg by mouth daily.      Marland Kitchen omega-3 acid ethyl esters (LOVAZA) 1 G capsule Take 2 g by mouth 2 (two) times daily.      .  Saxagliptin-Metformin (KOMBIGLYZE XR) 04-999 MG TB24 Take 1 tablet by mouth daily.       . ALPHAGAN P 0.1 % SOLN Apply 2 drops to eye 2 (two) times daily.      . finasteride (PROSCAR) 5 MG tablet Take 5 mg by mouth daily.      Marland Kitchen losartan (COZAAR) 50 MG tablet Take 50 mg by mouth daily.      Marland Kitchen LOTEMAX 0.5 % GEL 2 (two) times daily.      Marland Kitchen LUMIGAN 0.01 % SOLN 1 drop 2 (two) times daily.      . timolol (TIMOPTIC) 0.5 % ophthalmic solution Apply 1 drop to eye 2 (two) times daily.       No current facility-administered medications for this visit.    Past Medical History  Diagnosis Date  . Cataract   . Diabetes mellitus without complication   . Glaucoma   . Hypertension   . Retinal detachment     of left eye with laser surgery  . prostate ca dx'd 2008    seed implant    Past Surgical History  Procedure Laterality Date  . Eye surgery    . Hernia repair    . Joint replacement    . Prostate surgery      Family History  Problem Relation Age of Onset  .  Heart disease Mother   . Cancer Father     History   Social History  . Marital Status: Married    Spouse Name: N/A    Number of Children: N/A  . Years of Education: N/A   Occupational History  . Not on file.   Social History Main Topics  . Smoking status: Never Smoker   . Smokeless tobacco: Not on file  . Alcohol Use: No  . Drug Use: No  . Sexually Active: No   Other Topics Concern  . Not on file   Social History Narrative  . No narrative on file    Review of systems: The patient specifically denies any chest pain at rest or with exertion, dyspnea at rest or with exertion, orthopnea, paroxysmal nocturnal dyspnea, syncope, palpitations, focal neurological deficits, intermittent claudication, lower extremity edema, unexplained weight gain, cough, hemoptysis or wheezing.  The patient also denies abdominal pain, nausea, vomiting, dysphagia, diarrhea, constipation, polyuria, polydipsia, dysuria, hematuria, frequency,  urgency, abnormal bleeding or bruising, fever, chills, unexpected weight changes, mood swings, change in skin or hair texture, change in voice quality, auditory or visual problems, allergic reactions or rashes, new musculoskeletal complaints other than usual "aches and pains".   PHYSICAL EXAM BP 116/78  Pulse 64  Resp 16  Ht 6' (1.829 m)  Wt 192 lb (87.091 kg)  BMI 26.03 kg/m2  General: Alert, oriented x3, no distress Head: no evidence of trauma, PERRL, EOMI, no exophtalmos or lid lag, no myxedema, no xanthelasma; normal ears, nose and oropharynx Neck: normal jugular venous pulsations and no hepatojugular reflux; brisk carotid pulses without delay and no carotid bruits Chest: clear to auscultation, no signs of consolidation by percussion or palpation, normal fremitus, symmetrical and full respiratory excursions Cardiovascular: normal position and quality of the apical impulse, irregular rhythm, normal first and second heart sounds, no rubs or gallops, there is a very distinct decrescendo murmur heard best at the right and left lower sternal border radiating towards the base of the neck. It is holodiastolic. Abdomen: no tenderness or distention, no masses by palpation, no abnormal pulsatility or arterial bruits, normal bowel sounds, no hepatosplenomegaly Extremities: no clubbing, cyanosis or edema; 2+ radial, ulnar and brachial pulses bilaterally; 2+ right femoral, posterior tibial and dorsalis pedis pulses; 2+ left femoral, posterior tibial and dorsalis pedis pulses; no subclavian or femoral bruits Neurological: grossly nonfocal   EKG: Sinus rhythm with very frequent PACs, incomplete right bundle branch block and left anterior fascicular block. No ischemic repolarization abnormalities.  Lipid Panel    BMET    Component Value Date/Time   NA 139 07/23/2010 1301   K 4.8 07/23/2010 1301   CL 106 07/23/2010 1301   CO2 28 07/23/2010 1301   GLUCOSE 125* 07/23/2010 1301   BUN 18 07/23/2010 1301    CREATININE 0.95 07/23/2010 1301   CALCIUM 9.1 07/23/2010 1301   GFRNONAA >60 07/23/2010 1301   GFRAA  Value: >60        The eGFR has been calculated using the MDRD equation. This calculation has not been validated in all clinical situations. eGFR's persistently <60 mL/min signify possible Chronic Kidney Disease. 07/23/2010 1301     ASSESSMENT AND PLAN Arrhythmia, atrial Today's electrocardiogram clearly shows background sinus rhythm with very frequent PACs and an atrial couplet. I believe the same rhythm is present on the electrocardiogram performed earlier. There appears to be longer spell of accelerated atrial rhythm on that tracing. However I believe there are very distinct P waves seen.  At this point I do not recommend that he receive anticoagulants. His atrial arrhythmia may be a harbinger of occasional paroxysmal atrial fibrillation. He will wear 14 day event monitor. Continue taking aspirin daily.  DM (diabetes mellitus)    HTN (hypertension) Controlled. If he does indeed have aortic insufficiency , the angiotensin receptor blocker that he already is taking is quite appropriate.   Murmur, diastolic It sounds like he has at least mild, probably moderate aortic insufficiency. There are no peripheral signs of severe aortic insufficiency. His heart size by clinical exam appears to be normal. I recommend that he have an echocardiogram to clarify this diagnosis. He is approaching age 85 and is asymptomatic from a cardiac standpoint. Even if we find severe aortic insufficiency, I think we will be hard pressed to recommend aggressive therapy as long as he remains asymptomatic.  Orders Placed This Encounter  Procedures  . EKG 12-Lead  . Cardiac event monitor  . 2D Echocardiogram with contrast   Meds ordered this encounter  Medications  . furosemide (LASIX) 20 MG tablet    Sig: Take 20 mg by mouth daily.  . finasteride (PROSCAR) 5 MG tablet    Sig: Take 5 mg by mouth daily.  . timolol (TIMOPTIC)  0.5 % ophthalmic solution    Sig: Apply 1 drop to eye 2 (two) times daily.  Marland Kitchen losartan (COZAAR) 50 MG tablet    Sig: Take 50 mg by mouth daily.  . ALPHAGAN P 0.1 % SOLN    Sig: Apply 2 drops to eye 2 (two) times daily.  Marland Kitchen LUMIGAN 0.01 % SOLN    Sig: 1 drop 2 (two) times daily.  Marland Kitchen LOTEMAX 0.5 % GEL    Sig: 2 (two) times daily.    Junious Silk, MD, [facility] [phone] office [phone] pager

## 2013-07-29 DIAGNOSIS — I4891 Unspecified atrial fibrillation: Secondary | ICD-10-CM | POA: Diagnosis not present

## 2013-07-29 DIAGNOSIS — E049 Nontoxic goiter, unspecified: Secondary | ICD-10-CM | POA: Diagnosis not present

## 2013-08-04 DIAGNOSIS — E119 Type 2 diabetes mellitus without complications: Secondary | ICD-10-CM | POA: Diagnosis not present

## 2013-08-04 DIAGNOSIS — I10 Essential (primary) hypertension: Secondary | ICD-10-CM | POA: Diagnosis not present

## 2013-08-04 DIAGNOSIS — E039 Hypothyroidism, unspecified: Secondary | ICD-10-CM | POA: Diagnosis not present

## 2013-08-05 DIAGNOSIS — E039 Hypothyroidism, unspecified: Secondary | ICD-10-CM | POA: Diagnosis not present

## 2013-08-10 ENCOUNTER — Ambulatory Visit (HOSPITAL_COMMUNITY)
Admission: RE | Admit: 2013-08-10 | Discharge: 2013-08-10 | Disposition: A | Discharge status: Home / Self Care | Payer: Medicare Other | Source: Ambulatory Visit | Attending: Cardiology | Admitting: Cardiology

## 2013-08-10 DIAGNOSIS — I10 Essential (primary) hypertension: Secondary | ICD-10-CM | POA: Insufficient documentation

## 2013-08-10 DIAGNOSIS — I4891 Unspecified atrial fibrillation: Secondary | ICD-10-CM | POA: Diagnosis not present

## 2013-08-10 DIAGNOSIS — E119 Type 2 diabetes mellitus without complications: Secondary | ICD-10-CM | POA: Insufficient documentation

## 2013-08-10 DIAGNOSIS — R011 Cardiac murmur, unspecified: Secondary | ICD-10-CM

## 2013-08-10 DIAGNOSIS — R002 Palpitations: Secondary | ICD-10-CM | POA: Diagnosis not present

## 2013-08-10 DIAGNOSIS — E785 Hyperlipidemia, unspecified: Secondary | ICD-10-CM | POA: Insufficient documentation

## 2013-08-10 NOTE — Progress Notes (Signed)
[facility]   2D echo completed 08/10/2013.   Cindy Christinea Brizuela, RDCS  

## 2013-08-25 ENCOUNTER — Ambulatory Visit (INDEPENDENT_AMBULATORY_CARE_PROVIDER_SITE_OTHER): Payer: Medicare Other | Admitting: Cardiovascular Disease

## 2013-08-25 ENCOUNTER — Encounter: Payer: Self-pay | Admitting: Cardiovascular Disease

## 2013-08-25 VITALS — BP 138/76 | HR 68 | Resp 20 | Ht 72.0 in | Wt 192.6 lb

## 2013-08-25 DIAGNOSIS — I499 Cardiac arrhythmia, unspecified: Secondary | ICD-10-CM | POA: Insufficient documentation

## 2013-08-25 DIAGNOSIS — I10 Essential (primary) hypertension: Secondary | ICD-10-CM | POA: Diagnosis not present

## 2013-08-25 DIAGNOSIS — I351 Nonrheumatic aortic (valve) insufficiency: Secondary | ICD-10-CM

## 2013-08-25 DIAGNOSIS — I359 Nonrheumatic aortic valve disorder, unspecified: Secondary | ICD-10-CM

## 2013-08-25 DIAGNOSIS — E785 Hyperlipidemia, unspecified: Secondary | ICD-10-CM

## 2013-08-25 NOTE — Assessment & Plan Note (Addendum)
In normal range on medication

## 2013-08-25 NOTE — Assessment & Plan Note (Signed)
On statin therapy 

## 2013-08-25 NOTE — Assessment & Plan Note (Signed)
There is no evidence of atrial fibrillation on the patient's electrocardiograms or his event monitor. His rhythm irregularity is related to frequent PACs as well a second-degree atrioventricular block, Mobitz type I. The second degree AV block occurs exclusively during sleep. It is not symptomatic. He does not require pacemaker therapy at this time. It is preferable that we avoid medications with negative chronotropic affect. His PACs are asymptomatic and now avoid giving beta blockers or calcium channel blockers as this might precipitate worsening of his AV block.

## 2013-08-25 NOTE — Progress Notes (Signed)
Patient ID: Geoffrey Bennett, male   DOB: 1922-11-22, 79 y.o.   MRN: [id number]     Reason for office visit Followup echocardiogram and arrhythmia/event monitor  Mr Townley is in today with no complaints. He denies syncope, dizziness, fatigue, shortness of breath or chest pain. He was initially referred for evaluation for an electrocardiogram that was interpreted as possibly representing atrial fibrillation. He has 1 a 14 day event monitor that shows frequent episodes of irregular rhythm related to PACs, blocked PACs as well as second-degree atrioventricular block Mobitz type I. The Wenckebach block only happens during sleep. The PACs are frequent, but asymptomatic.  He had a fairly loud murmur on exam but the echocardiogram shows that he has only mild aortic insufficiency. The left ventricle is normal in size and systolic function.  He exercises on a regular basis and has not noticed any reduction in his stamina.    No Known Allergies  Current Outpatient Prescriptions  Medication Sig Dispense Refill  . ALPHAGAN P 0.1 % SOLN Apply 2 drops to eye 2 (two) times daily.      Marland Kitchen aspirin 81 MG tablet Take 81 mg by mouth daily.      Marland Kitchen atorvastatin (LIPITOR) 20 MG tablet Take 20 mg by mouth daily.      . cholecalciferol (VITAMIN D) 1000 UNITS tablet Take 1,000 Units by mouth daily.      . finasteride (PROSCAR) 5 MG tablet Take 5 mg by mouth daily.      . fish oil-omega-3 fatty acids 1000 MG capsule Take 2 g by mouth daily.      . furosemide (LASIX) 20 MG tablet Take 20 mg by mouth daily.      Marland Kitchen losartan (COZAAR) 50 MG tablet Take 50 mg by mouth daily.      Marland Kitchen LOTEMAX 0.5 % GEL 2 (two) times daily.      Marland Kitchen LUMIGAN 0.01 % SOLN 1 drop 2 (two) times daily.      Marland Kitchen omega-3 acid ethyl esters (LOVAZA) 1 G capsule Take 2 g by mouth 2 (two) times daily.      . Saxagliptin-Metformin (KOMBIGLYZE XR) 04-999 MG TB24 Take 1 tablet by mouth daily.       . timolol (TIMOPTIC) 0.5 % ophthalmic solution Apply 1 drop  to eye 2 (two) times daily.       No current facility-administered medications for this visit.    Past Medical History  Diagnosis Date  . Cataract   . Diabetes mellitus without complication   . Glaucoma   . Hypertension   . Retinal detachment     of left eye with laser surgery  . prostate ca dx'd 2008    seed implant    Past Surgical History  Procedure Laterality Date  . Eye surgery    . Hernia repair    . Joint replacement    . Prostate surgery      Family History  Problem Relation Age of Onset  . Heart disease Mother   . Cancer Father     History   Social History  . Marital Status: Married    Spouse Name: N/A    Number of Children: N/A  . Years of Education: N/A   Occupational History  . Not on file.   Social History Main Topics  . Smoking status: Never Smoker   . Smokeless tobacco: Not on file  . Alcohol Use: No  . Drug Use: No  . Sexual Activity: No  Other Topics Concern  . Not on file   Social History Narrative  . No narrative on file    Review of systems: The patient specifically denies any chest pain at rest or with exertion, dyspnea at rest or with exertion, orthopnea, paroxysmal nocturnal dyspnea, syncope, palpitations, focal neurological deficits, intermittent claudication, lower extremity edema, unexplained weight gain, cough, hemoptysis or wheezing.  The patient also denies abdominal pain, nausea, vomiting, dysphagia, diarrhea, constipation, polyuria, polydipsia, dysuria, hematuria, frequency, urgency, abnormal bleeding or bruising, fever, chills, unexpected weight changes, mood swings, change in skin or hair texture, change in voice quality, auditory or visual problems, allergic reactions or rashes, new musculoskeletal complaints other than usual "aches and pains".   PHYSICAL EXAM BP 138/76  Pulse 68  Resp 20  Ht 6' (1.829 m)  Wt 192 lb 9.6 oz (87.363 kg)  BMI 26.12 kg/m2 General: Alert, oriented x3, no distress  Head: no evidence of  trauma, PERRL, EOMI, no exophtalmos or lid lag, no myxedema, no xanthelasma; normal ears, nose and oropharynx  Neck: normal jugular venous pulsations and no hepatojugular reflux; brisk carotid pulses without delay and no carotid bruits  Chest: clear to auscultation, no signs of consolidation by percussion or palpation, normal fremitus, symmetrical and full respiratory excursions  Cardiovascular: normal position and quality of the apical impulse, irregular rhythm, normal first and second heart sounds, no rubs or gallops, there is a very distinct decrescendo murmur heard best at the right and left lower sternal border radiating towards the base of the neck. It is holodiastolic.  Abdomen: no tenderness or distention, no masses by palpation, no abnormal pulsatility or arterial bruits, normal bowel sounds, no hepatosplenomegaly  Extremities: no clubbing, cyanosis or edema; 2+ radial, ulnar and brachial pulses bilaterally; 2+ right femoral, posterior tibial and dorsalis pedis pulses; 2+ left femoral, posterior tibial and dorsalis pedis pulses; no subclavian or femoral bruits  Neurological: grossly nonfocal   Lipid Panel  No results found for this basename: chol, trig, hdl, cholhdl, vldl, ldlcalc    BMET    Component Value Date/Time   NA 139 07/23/2010 1301   K 4.8 07/23/2010 1301   CL 106 07/23/2010 1301   CO2 28 07/23/2010 1301   GLUCOSE 125* 07/23/2010 1301   BUN 18 07/23/2010 1301   CREATININE 0.95 07/23/2010 1301   CALCIUM 9.1 07/23/2010 1301   GFRNONAA >60 07/23/2010 1301   GFRAA  Value: >60        The eGFR has been calculated using the MDRD equation. This calculation has not been validated in all clinical situations. eGFR's persistently <60 mL/min signify possible Chronic Kidney Disease. 07/23/2010 1301     ASSESSMENT AND PLAN Arrhythmia There is no evidence of atrial fibrillation on the patient's electrocardiograms or his event monitor. His rhythm irregularity is related to frequent PACs as well a  second-degree atrioventricular block, Mobitz type I. The second degree AV block occurs exclusively during sleep. It is not symptomatic. He does not require pacemaker therapy at this time. It is preferable that we avoid medications with negative chronotropic affect. His PACs are asymptomatic and now avoid giving beta blockers or calcium channel blockers as this might precipitate worsening of his AV block.  Aortic insufficiency Despite the rather prominent murmur on physical exam this is only mild in severity. Routine monitoring is not necessary unless he develops shortness of breath or other signs of congestive heart failure  HTN (hypertension) In normal range on medication  Hyperlipidemia  On statin therapy  Junious Silk, MD, [facility] [phone] office [phone] pager

## 2013-08-25 NOTE — Assessment & Plan Note (Signed)
Despite the rather prominent murmur on physical exam this is only mild in severity. Routine monitoring is not necessary unless he develops shortness of breath or other signs of congestive heart failure

## 2013-08-25 NOTE — Patient Instructions (Addendum)
Your physician recommends that you schedule a follow-up appointment in: 1 year  

## 2013-09-28 ENCOUNTER — Encounter: Payer: Self-pay | Admitting: Cardiovascular Disease

## 2013-10-14 DIAGNOSIS — Z23 Encounter for immunization: Secondary | ICD-10-CM | POA: Diagnosis not present

## 2013-10-27 DIAGNOSIS — E039 Hypothyroidism, unspecified: Secondary | ICD-10-CM | POA: Diagnosis not present

## 2013-10-27 DIAGNOSIS — E7800 Pure hypercholesterolemia, unspecified: Secondary | ICD-10-CM | POA: Diagnosis not present

## 2013-10-27 DIAGNOSIS — E119 Type 2 diabetes mellitus without complications: Secondary | ICD-10-CM | POA: Diagnosis not present

## 2013-11-01 DIAGNOSIS — E7800 Pure hypercholesterolemia, unspecified: Secondary | ICD-10-CM | POA: Diagnosis not present

## 2013-11-01 DIAGNOSIS — E119 Type 2 diabetes mellitus without complications: Secondary | ICD-10-CM | POA: Diagnosis not present

## 2013-11-01 DIAGNOSIS — I10 Essential (primary) hypertension: Secondary | ICD-10-CM | POA: Diagnosis not present

## 2013-11-01 DIAGNOSIS — E875 Hyperkalemia: Secondary | ICD-10-CM | POA: Diagnosis not present

## 2013-11-04 DIAGNOSIS — E875 Hyperkalemia: Secondary | ICD-10-CM | POA: Diagnosis not present

## 2013-11-04 DIAGNOSIS — E119 Type 2 diabetes mellitus without complications: Secondary | ICD-10-CM | POA: Diagnosis not present

## 2013-11-04 DIAGNOSIS — E0789 Other specified disorders of thyroid: Secondary | ICD-10-CM | POA: Diagnosis not present

## 2013-11-08 DIAGNOSIS — E119 Type 2 diabetes mellitus without complications: Secondary | ICD-10-CM | POA: Diagnosis not present

## 2013-11-22 ENCOUNTER — Ambulatory Visit (HOSPITAL_COMMUNITY)
Admission: RE | Admit: 2013-11-22 | Discharge: 2013-11-22 | Disposition: A | Discharge status: Home / Self Care | Payer: Medicare Other | Source: Ambulatory Visit | Attending: Family Medicine | Admitting: Family Medicine

## 2013-11-22 ENCOUNTER — Ambulatory Visit (INDEPENDENT_AMBULATORY_CARE_PROVIDER_SITE_OTHER): Payer: Medicare Other | Admitting: Family Medicine

## 2013-11-22 VITALS — BP 128/68 | HR 83 | Temp 98.1°F | Resp 16 | Ht 71.0 in | Wt 197.0 lb

## 2013-11-22 DIAGNOSIS — S81009A Unspecified open wound, unspecified knee, initial encounter: Secondary | ICD-10-CM

## 2013-11-22 DIAGNOSIS — M79609 Pain in unspecified limb: Secondary | ICD-10-CM | POA: Diagnosis not present

## 2013-11-22 DIAGNOSIS — S81802A Unspecified open wound, left lower leg, initial encounter: Secondary | ICD-10-CM

## 2013-11-22 DIAGNOSIS — M7989 Other specified soft tissue disorders: Secondary | ICD-10-CM | POA: Diagnosis not present

## 2013-11-22 MED ORDER — MUPIROCIN CALCIUM 2 % EX CREA: 1.0000 "application " | TOPICAL_CREAM | Freq: Two times a day (BID) | CUTANEOUS | Status: DC

## 2013-11-22 MED ORDER — DOXYCYCLINE HYCLATE 100 MG PO TABS: 100.0000 mg | ORAL_TABLET | Freq: Two times a day (BID) | ORAL | Status: DC

## 2013-11-22 NOTE — Progress Notes (Signed)
VASCULAR LAB PRELIMINARY  PRELIMINARY  PRELIMINARY  PRELIMINARY  Left lower extremity venous duplex completed.    Preliminary report:  Left:  No evidence of DVT, superficial thrombosis, or Baker's cyst.  Makalia Bare, RVT 11/22/2013, 11:18 AM

## 2013-11-22 NOTE — Progress Notes (Signed)
79 yo man who lacerated anterolateral mid lower left extremity on car door 7 days ago. The leg has swollen and turned red with some mild pain in the calf. H/O left foot ichthyosis No chest pain or shortness of breath.  He's continued to go to the gym all last week He has been using neosporin  Objective:  NAD Left leg:  Angular 3 cm open laceration with lower extremity edema and erythema.  Assessment:  Cellulitis left leg with open wound  Plan:   Venous doppler Doxycycline 100 mg bid x 7 days bactroban cream tid  Signed, Elvina Sidle, MD  PS:  Venous doppler negative  Recommend follow up 48 hours.

## 2013-11-24 ENCOUNTER — Ambulatory Visit (INDEPENDENT_AMBULATORY_CARE_PROVIDER_SITE_OTHER): Payer: Medicare Other | Admitting: Family Medicine

## 2013-11-24 ENCOUNTER — Telehealth: Payer: Self-pay | Admitting: Radiology

## 2013-11-24 VITALS — BP 120/60 | HR 80 | Temp 97.7°F | Resp 16 | Ht 71.0 in | Wt 197.0 lb

## 2013-11-24 DIAGNOSIS — R609 Edema, unspecified: Secondary | ICD-10-CM

## 2013-11-24 DIAGNOSIS — L0291 Cutaneous abscess, unspecified: Secondary | ICD-10-CM

## 2013-11-24 DIAGNOSIS — S81009A Unspecified open wound, unspecified knee, initial encounter: Secondary | ICD-10-CM | POA: Diagnosis not present

## 2013-11-24 DIAGNOSIS — L0390 Cellulitis, unspecified: Secondary | ICD-10-CM

## 2013-11-24 DIAGNOSIS — R600 Localized edema: Secondary | ICD-10-CM

## 2013-11-24 MED ORDER — TORSEMIDE 10 MG PO TABS: 10.0000 mg | ORAL_TABLET | Freq: Every day | ORAL | Status: DC

## 2013-11-24 NOTE — Telephone Encounter (Signed)
Message copied by Caffie Damme on Wed Nov 24, 2013  3:12 PM ------      Message from: Elvina Sidle      Created: Mon Nov 22, 2013  1:26 PM       Please let patient know of normal doppler and need to recheck in 48 hours to make sure of improvement ------

## 2013-11-24 NOTE — Telephone Encounter (Signed)
Called patient to advise. He states he will try to come in this evening to be seen.

## 2013-11-24 NOTE — Patient Instructions (Signed)
Return on Sunday the 7th of December Stop Lasix Start Demadex daily Continue the antibiotic

## 2013-11-24 NOTE — Progress Notes (Signed)
Subjective:    Patient ID: Geoffrey Bennett, male    DOB: 01-30-1922, 79 y.o.   MRN: [id number]  This chart was scribed for Elvina Sidle, MD by Blanchard Kelch, ED Scribe. The patient was seen in room 10. Patient's care was started at 4:06 PM.   HPI  Geoffrey Bennett is a 79 y.o. male who presents to office for a follow up. He was seen by me two days ago for a laceration on his left leg that got infected.  He states that the area feels tight due to the swelling. It is also sensitive to touch. However, he states that it is gradually healing. He has returned to the gym but places his left leg on a raised pillow after working out.   He is currently taking Furosemide once a day.    Past Medical History  Diagnosis Date   Cataract    Diabetes mellitus without complication    Glaucoma    Hypertension    Retinal detachment     of left eye with laser surgery   prostate ca dx'd 2008    seed implant   Past Surgical History  Procedure Laterality Date   Eye surgery     Hernia repair     Joint replacement     Prostate surgery     Family History  Problem Relation Age of Onset   Heart disease Mother    Cancer Father    Current Outpatient Prescriptions on File Prior to Visit  Medication Sig Dispense Refill   ALPHAGAN P 0.1 % SOLN Apply 2 drops to eye 2 (two) times daily.       aspirin 81 MG tablet Take 81 mg by mouth daily.       atorvastatin (LIPITOR) 20 MG tablet Take 20 mg by mouth daily.       cholecalciferol (VITAMIN D) 1000 UNITS tablet Take 1,000 Units by mouth daily.       doxycycline (VIBRA-TABS) 100 MG tablet Take 1 tablet (100 mg total) by mouth 2 (two) times daily.  20 tablet  0   finasteride (PROSCAR) 5 MG tablet Take 5 mg by mouth daily.       fish oil-omega-3 fatty acids 1000 MG capsule Take 2 g by mouth daily.       furosemide (LASIX) 20 MG tablet Take 20 mg by mouth daily.       losartan (COZAAR) 50 MG tablet Take 50 mg by mouth daily.        LOTEMAX 0.5 % GEL 2 (two) times daily.       LUMIGAN 0.01 % SOLN 1 drop 2 (two) times daily.       mupirocin cream (BACTROBAN) 2 % Apply 1 application topically 2 (two) times daily.  15 g  0   omega-3 acid ethyl esters (LOVAZA) 1 G capsule Take 2 g by mouth 2 (two) times daily.       Saxagliptin-Metformin (KOMBIGLYZE XR) 04-999 MG TB24 Take 1 tablet by mouth daily.        timolol (TIMOPTIC) 0.5 % ophthalmic solution Apply 1 drop to eye 2 (two) times daily.       No current facility-administered medications on file prior to visit.   No Known Allergies   Review of Systems  Constitutional: Negative for fever.  HENT: Negative for drooling.   Eyes: Negative for discharge.  Respiratory: Negative for cough.   Cardiovascular: Positive for leg swelling.  Gastrointestinal: Negative for vomiting.  Endocrine: Negative  for polyuria.  Genitourinary: Negative for hematuria.  Musculoskeletal: Negative for gait problem.  Skin: Positive for wound. Negative for rash.  Allergic/Immunologic: Negative for immunocompromised state.  Neurological: Negative for speech difficulty.  Hematological: Negative for adenopathy.  Psychiatric/Behavioral: Negative for confusion.       Objective:   Physical Exam  Nursing note and vitals reviewed. CONSTITUTIONAL: Well developed/well nourished HEAD: Normocephalic/atraumatic EYES: EOMI/PERRL ENMT: Mucous membranes moist NECK: supple no meningeal signs SPINE:entire spine nontender CV: S1/S2 noted, no murmurs/rubs/gallops noted LUNGS: Lungs are clear to auscultation bilaterally, no apparent distress ABDOMEN: soft, nontender, no rebound or guarding GU:no cva tenderness NEURO: Pt is awake/alert, moves all extremitiesx4 EXTREMITIES: pulses normal, full ROM, healing laceration to left calf with mild surrounding erythema, pedal and calf edema SKIN: warm, pink (less erythematous) lower extremity with wound appearing about the same.   PSYCH: no abnormalities of  mood noted         Assessment & Plan:    I personally performed the services described in this documentation, which was scribed in my presence. The recorded information has been reviewed and is accurate.  Pedal edema - Plan: torsemide (DEMADEX) 10 MG tablet  Cellulitis - Plan: torsemide (DEMADEX) 10 MG tablet  Open wound of knee, leg (except thigh), and ankle, complicated, unspecified laterality, initial encounter - Plan: torsemide (DEMADEX) 10 MG tablet  Signed, Elvina Sidle, MD

## 2013-11-28 ENCOUNTER — Ambulatory Visit (INDEPENDENT_AMBULATORY_CARE_PROVIDER_SITE_OTHER): Payer: Medicare Other | Admitting: Family Medicine

## 2013-11-28 VITALS — BP 132/84 | HR 80 | Temp 98.4°F | Resp 18 | Wt 200.0 lb

## 2013-11-28 DIAGNOSIS — S81002S Unspecified open wound, left knee, sequela: Secondary | ICD-10-CM

## 2013-11-28 DIAGNOSIS — IMO0002 Reserved for concepts with insufficient information to code with codable children: Secondary | ICD-10-CM

## 2013-11-28 DIAGNOSIS — L0390 Cellulitis, unspecified: Secondary | ICD-10-CM

## 2013-11-28 DIAGNOSIS — R600 Localized edema: Secondary | ICD-10-CM

## 2013-11-28 DIAGNOSIS — R609 Edema, unspecified: Secondary | ICD-10-CM | POA: Diagnosis not present

## 2013-11-28 DIAGNOSIS — L0291 Cutaneous abscess, unspecified: Secondary | ICD-10-CM

## 2013-11-28 NOTE — Patient Instructions (Signed)
Please increase your torsemide to two pills a day.  Recheck Tuesday the 16th.

## 2013-11-28 NOTE — Progress Notes (Signed)
Subjective:  This chart was scribed for Elvina Sidle, MD by Whalen Best, Medical Scribe. This patient was seen in Room 9 and the patient's care was started at 10:57 AM.  Patient ID: Geoffrey Bennett, male    DOB: 05/06/1922, 79 y.o.   MRN: [id number]  HPI HPI Comments: Geoffrey Bennett is a 79 y.o. male with a history of DM and Hypertension who presents to the [facility] for a follow-up appointment for an infected laceration to his left leg that occurred a week ago.  The patient states that his left leg feels better everyday.  The patient states that he is taking his antibiotics with 8 oz of water.  He states that he also drinks 8 oz of water after urination.  He states that he is elevating his leg while at home with a pillow on a recliner.  He states that he does not elevate his leg to heart level.  He states that before the laceration occurred, both of his legs were already swollen.  He states that the swelling is significantly less in the morning.  He states that he stands for an hour and a half to two hours while at church.  He states that he is still taking Torsemide but is unable to tell whether or not the medication makes him urinate frequently because he drinks fluid so frequently throughout the day.  The patient states that he used to work for two [employer] as the [employer] for the state of [place] for 20 years.     Past Medical History  Diagnosis Date   Cataract    Diabetes mellitus without complication    Glaucoma    Hypertension    Retinal detachment     of left eye with laser surgery   prostate ca dx'd 2008    seed implant   Past Surgical History  Procedure Laterality Date   Eye surgery     Hernia repair     Joint replacement     Prostate surgery     Family History  Problem Relation Age of Onset   Heart disease Mother    Cancer Father    History   Social History   Marital Status: Married   Spouse Name: N/A    Number of Children: N/A   Years of Education: N/A   Occupational History   Not on file.   Social History Main Topics   Smoking status: Never Smoker    Smokeless tobacco: Not on file   Alcohol Use: No   Drug Use: No   Sexual Activity: No   Other Topics Concern   Not on file   Social History Narrative   No narrative on file   No Known Allergies   Review of Systems  Cardiovascular: Positive for leg swelling (bilateral legs).  Skin: Positive for wound (left leg).  All other systems reviewed and are negative.     Objective:  Physical Exam  Left leg shows the same laceration extending of the middle section. He still has surrounding erythema and 2+ edema which is pitting. There is no tenderness or fluctuance in the surrounding area.    Assessment & Plan:   I personally performed the services described in this documentation, which was scribed in my presence. The recorded information has been reviewed and is accurate.  Infection seems to be resolved but the continued edema his presenting problem.  I've asked patient to come back in  9 days, increase the torsemide to 20 mg daily, and elevate the leg whenever possible with 2 pillows.  Signed, Elvina Sidle

## 2013-12-02 MED ORDER — DOXYCYCLINE HYCLATE 100 MG PO TABS: 100.0000 mg | ORAL_TABLET | Freq: Two times a day (BID) | ORAL | Status: DC

## 2013-12-02 MED ORDER — MUPIROCIN CALCIUM 2 % EX CREA: 1.0000 "application " | TOPICAL_CREAM | Freq: Two times a day (BID) | CUTANEOUS | Status: DC

## 2013-12-02 NOTE — Addendum Note (Signed)
Addended by: Elvina Sidle on: 12/02/2013 09:42 AM   Modules accepted: Orders

## 2013-12-07 ENCOUNTER — Ambulatory Visit (INDEPENDENT_AMBULATORY_CARE_PROVIDER_SITE_OTHER): Payer: Medicare Other | Admitting: Family Medicine

## 2013-12-07 ENCOUNTER — Encounter: Payer: Self-pay | Admitting: Family Medicine

## 2013-12-07 VITALS — BP 120/64 | HR 71 | Temp 97.7°F | Resp 16 | Ht 71.0 in | Wt 195.0 lb

## 2013-12-07 DIAGNOSIS — H3340 Traction detachment of retina, unspecified eye: Secondary | ICD-10-CM | POA: Diagnosis not present

## 2013-12-07 DIAGNOSIS — H2010 Chronic iridocyclitis, unspecified eye: Secondary | ICD-10-CM | POA: Diagnosis not present

## 2013-12-07 DIAGNOSIS — L03119 Cellulitis of unspecified part of limb: Secondary | ICD-10-CM | POA: Diagnosis not present

## 2013-12-07 DIAGNOSIS — L02419 Cutaneous abscess of limb, unspecified: Secondary | ICD-10-CM

## 2013-12-07 DIAGNOSIS — H35379 Puckering of macula, unspecified eye: Secondary | ICD-10-CM | POA: Diagnosis not present

## 2013-12-07 DIAGNOSIS — L03116 Cellulitis of left lower limb: Secondary | ICD-10-CM

## 2013-12-07 NOTE — Progress Notes (Signed)
79 yo gentleman with left lateral lower leg laceration and subsequent cellulitis.  No significant pain.  Continues on doxycycline patient notes good resolution of edema when he elevates the leg but that he comes back as he is on his feet.  Patient has resumed his exercise routine.  Objective:  Wound has healed with new skin and mild swelling.  Surrounding erythema is diminished.   There is 2+ edema in the lower extremity but the skin is not as tight as it has been.  Tegaderm/silvadene dressing applied  Assessment:  Gradual improvement of wound with new skin forming nicely. The edema continues.  Plan:  Recheck left leg on Sunday CPM  No further doxycycline.  Mosie Epstein, MD

## 2013-12-07 NOTE — Patient Instructions (Addendum)
   Please return Sunday 21 December after noon and before 6 o'clock.

## 2013-12-12 ENCOUNTER — Ambulatory Visit (INDEPENDENT_AMBULATORY_CARE_PROVIDER_SITE_OTHER): Payer: Medicare Other | Admitting: Family Medicine

## 2013-12-12 VITALS — BP 110/72 | HR 77 | Temp 97.9°F | Resp 17 | Ht 71.0 in | Wt 192.0 lb

## 2013-12-12 DIAGNOSIS — L0390 Cellulitis, unspecified: Secondary | ICD-10-CM

## 2013-12-12 DIAGNOSIS — S81001S Unspecified open wound, right knee, sequela: Secondary | ICD-10-CM

## 2013-12-12 DIAGNOSIS — M79609 Pain in unspecified limb: Secondary | ICD-10-CM

## 2013-12-12 DIAGNOSIS — R600 Localized edema: Secondary | ICD-10-CM

## 2013-12-12 MED ORDER — TORSEMIDE 20 MG PO TABS: 20.0000 mg | ORAL_TABLET | Freq: Every day | ORAL | Status: DC

## 2013-12-12 NOTE — Patient Instructions (Addendum)
Increase the demadex to 20 mg daily Recheck the wound in a week. Dr. Cleta Alberts is working 10 to 6 on the day after Christmas

## 2013-12-12 NOTE — Progress Notes (Signed)
79 yo man with slow healing laceration left lower leg, initially accompanied by cellulitis  Took last doxycycline today.  Continue to dress the wound with silvadene daily.  Taking Demadex and elevating leg  Objective:  Left leg:  Wound looks stable. New skin is filling in.  2 cm puffy laceration without discharge. Skin is clean and dry.  There continues to be 2+ edema to knee on left foot and leg contrasted with trace on the right side.  Assessment:  Stable, slow healing  Plan:  Continue weekly checks Increase the coban compression dressing Increase the demadex to 20 mg daily  Signed, Elvina Sidle, MD

## 2013-12-17 ENCOUNTER — Ambulatory Visit (INDEPENDENT_AMBULATORY_CARE_PROVIDER_SITE_OTHER): Payer: Medicare Other | Admitting: Emergency Medicine

## 2013-12-17 VITALS — BP 120/74 | HR 74 | Temp 97.7°F | Resp 17 | Ht 71.0 in | Wt 190.0 lb

## 2013-12-17 DIAGNOSIS — L0291 Cutaneous abscess, unspecified: Secondary | ICD-10-CM

## 2013-12-17 DIAGNOSIS — L02419 Cutaneous abscess of limb, unspecified: Secondary | ICD-10-CM

## 2013-12-17 DIAGNOSIS — L0390 Cellulitis, unspecified: Secondary | ICD-10-CM

## 2013-12-17 DIAGNOSIS — L03116 Cellulitis of left lower limb: Secondary | ICD-10-CM

## 2013-12-17 NOTE — Progress Notes (Signed)
   Subjective:    Patient ID: Geoffrey Bennett, male    DOB: [birth year], 79 y.o.   MRN: [id number]  HPI 79 year old male presents to [facility] for wound recheck. Wound check front of left lower leg. Significant improvement.   Patient is cleaning wound with soap and water, placing ointment and gauze wrap on wound each day.  Patient goes to gym 5 days a week at 5 am.     Review of Systems     Objective:   Physical Exam there is a 2 x 3 cm fluctuant area over the mid left shin. There is no open area noted. There is no tenderness to palpation.        Assessment & Plan:  Plan to recheck once more time. I suspect he had a hematoma and is developing as seroma and I think if we could avoid doing anything that would be the best .

## 2013-12-24 ENCOUNTER — Ambulatory Visit (INDEPENDENT_AMBULATORY_CARE_PROVIDER_SITE_OTHER): Payer: Medicare Other | Admitting: Emergency Medicine

## 2013-12-24 VITALS — BP 112/62 | HR 77 | Temp 98.1°F | Resp 18 | Ht 71.0 in | Wt 191.0 lb

## 2013-12-24 DIAGNOSIS — L0291 Cutaneous abscess, unspecified: Secondary | ICD-10-CM | POA: Diagnosis not present

## 2013-12-24 DIAGNOSIS — S81802S Unspecified open wound, left lower leg, sequela: Secondary | ICD-10-CM

## 2013-12-24 DIAGNOSIS — L0390 Cellulitis, unspecified: Secondary | ICD-10-CM | POA: Diagnosis not present

## 2013-12-24 DIAGNOSIS — L03119 Cellulitis of unspecified part of limb: Secondary | ICD-10-CM | POA: Diagnosis not present

## 2013-12-24 DIAGNOSIS — L02419 Cutaneous abscess of limb, unspecified: Secondary | ICD-10-CM

## 2013-12-24 NOTE — Progress Notes (Signed)
Subjective:  This chart was scribed for Arlyss Queen, MD by Roxan Diesel, ED scribe.  This patient was seen in [place] [facility] and the patient's care was started at 10:40 AM.   Patient ID: Geoffrey Bennett, male    DOB: [birth year], 80 y.o.   MRN: [id number]  Chief Complaint  Patient presents with  . Follow-up    cellulitis left leg     HPI  HPI Comments: Geoffrey Bennett is a 80 y.o. male who presents to [facility] for f/u for cellulitis to his left lower leg.  Pt states the area has been healing well to his knowledge.  He has been keeping up with his home care regimen.  His daughter bathes the area every morning, applies ointment, and leaves it exposed for 6-8 hours.  They wrap the area again with ointment before bed.  He states that symptoms have been improving and he has no other complaints at this time.   Patient Active Problem List   Diagnosis Date Noted  . Aortic insufficiency 08/25/2013  . Arrhythmia 08/25/2013  . Arrhythmia, atrial 07/28/2013  . Hyperlipidemia 07/28/2013  . DM (diabetes mellitus) 03/22/2013  . HTN (hypertension) 03/22/2013  . BPH (benign prostatic hyperplasia) 03/22/2013  . Hearing loss 03/22/2013    Past Medical History  Diagnosis Date  . Cataract   . Diabetes mellitus without complication   . Glaucoma   . Hypertension   . Retinal detachment     of left eye with laser surgery  . prostate ca dx'd 2008    seed implant    Past Surgical History  Procedure Laterality Date  . Eye surgery    . Hernia repair    . Joint replacement    . Prostate surgery      Prior to Admission medications   Medication Sig Start Date End Date Taking? Authorizing Provider  ALPHAGAN P 0.1 % SOLN Apply 2 drops to eye 2 (two) times daily. 07/13/13  Yes Historical Provider, MD  aspirin 81 MG tablet Take 81 mg by mouth daily.   Yes Historical Provider, MD  atorvastatin (LIPITOR) 20 MG tablet Take 20 mg by mouth daily.   Yes Historical Provider, MD  cholecalciferol (VITAMIN  D) 1000 UNITS tablet Take 1,000 Units by mouth daily.   Yes Historical Provider, MD  finasteride (PROSCAR) 5 MG tablet Take 5 mg by mouth daily. 07/26/13  Yes Historical Provider, MD  fish oil-omega-3 fatty acids 1000 MG capsule Take 2 g by mouth daily.   Yes Historical Provider, MD  furosemide (LASIX) 20 MG tablet Take 20 mg by mouth daily.   Yes Historical Provider, MD  losartan (COZAAR) 50 MG tablet Take 50 mg by mouth daily. 06/19/13  Yes Historical Provider, MD  LOTEMAX 0.5 % GEL 2 (two) times daily. 06/18/13  Yes Historical Provider, MD  LUMIGAN 0.01 % SOLN 1 drop 2 (two) times daily. 06/10/13  Yes Historical Provider, MD  mupirocin cream (BACTROBAN) 2 % Apply 1 application topically 2 (two) times daily. 12/02/13  Yes Robyn Haber, MD  omega-3 acid ethyl esters (LOVAZA) 1 G capsule Take 2 g by mouth 2 (two) times daily.   Yes Historical Provider, MD  Saxagliptin-Metformin (KOMBIGLYZE XR) 04-999 MG TB24 Take 1 tablet by mouth daily.    Yes Historical Provider, MD  timolol (TIMOPTIC) 0.5 % ophthalmic solution Apply 1 drop to eye 2 (two) times daily. 06/18/13  Yes Historical Provider, MD  torsemide (DEMADEX) 20 MG tablet Take 1 tablet (20  mg total) by mouth daily. 12/12/13  Yes Robyn Haber, MD  doxycycline (VIBRA-TABS) 100 MG tablet Take 1 tablet (100 mg total) by mouth 2 (two) times daily. 12/02/13   Robyn Haber, MD     Review of Systems  Constitutional: Negative for fever.  Skin:       Redness and swelling to left lower leg        Objective:   Physical Exam CONSTITUTIONAL: Well developed/well nourished HEAD: Normocephalic/atraumatic EYES: EOMI/PERRL ENMT: Mucous membranes moist NECK: supple no meningeal signs SPINE:entire spine nontender CV: S1/S2 noted, no murmurs/rubs/gallops noted LUNGS: Lungs are clear to auscultation bilaterally, no apparent distress ABDOMEN: soft, nontender, no rebound or guarding GU:no cva tenderness NEURO: Pt is awake/alert, moves all  extremitiesx4 EXTREMITIES: There is a 2 x 3 cm fluctuant area over the lower left tibia there is no surrounding redness there is no induration  SKIN: warm, color normal PSYCH: no abnormalities of mood noted        Assessment & Plan:  Patient has a residual seroma at the site of his wound. There is no evidence of infection. We discussed the possibility of aspiration and I agree with him it would be better to avoid this.    I personally performed the services described in this documentation, which was scribed in my presence. The recorded information has been reviewed and is accurate.

## 2014-01-03 ENCOUNTER — Other Ambulatory Visit: Payer: Self-pay | Admitting: Family Medicine

## 2014-01-05 NOTE — Telephone Encounter (Signed)
Dr Everlene Farrier you have seen pt last 2 times. Do you want to RF this or does pt need to RTC?

## 2014-01-12 DIAGNOSIS — C61 Malignant neoplasm of prostate: Secondary | ICD-10-CM | POA: Diagnosis not present

## 2014-01-12 DIAGNOSIS — R972 Elevated prostate specific antigen [PSA]: Secondary | ICD-10-CM | POA: Diagnosis not present

## 2014-01-12 DIAGNOSIS — R350 Frequency of micturition: Secondary | ICD-10-CM | POA: Diagnosis not present

## 2014-01-19 DIAGNOSIS — C61 Malignant neoplasm of prostate: Secondary | ICD-10-CM | POA: Diagnosis not present

## 2014-01-19 DIAGNOSIS — R350 Frequency of micturition: Secondary | ICD-10-CM | POA: Diagnosis not present

## 2014-02-24 DIAGNOSIS — E119 Type 2 diabetes mellitus without complications: Secondary | ICD-10-CM | POA: Diagnosis not present

## 2014-02-24 DIAGNOSIS — E7800 Pure hypercholesterolemia, unspecified: Secondary | ICD-10-CM | POA: Diagnosis not present

## 2014-03-01 DIAGNOSIS — E7800 Pure hypercholesterolemia, unspecified: Secondary | ICD-10-CM | POA: Diagnosis not present

## 2014-03-01 DIAGNOSIS — I10 Essential (primary) hypertension: Secondary | ICD-10-CM | POA: Diagnosis not present

## 2014-03-01 DIAGNOSIS — E119 Type 2 diabetes mellitus without complications: Secondary | ICD-10-CM | POA: Diagnosis not present

## 2014-05-11 DIAGNOSIS — E039 Hypothyroidism, unspecified: Secondary | ICD-10-CM | POA: Diagnosis not present

## 2014-05-12 DIAGNOSIS — I4891 Unspecified atrial fibrillation: Secondary | ICD-10-CM | POA: Diagnosis not present

## 2014-05-12 DIAGNOSIS — E0789 Other specified disorders of thyroid: Secondary | ICD-10-CM | POA: Diagnosis not present

## 2014-05-12 DIAGNOSIS — E789 Disorder of lipoprotein metabolism, unspecified: Secondary | ICD-10-CM | POA: Diagnosis not present

## 2014-05-12 DIAGNOSIS — E119 Type 2 diabetes mellitus without complications: Secondary | ICD-10-CM | POA: Diagnosis not present

## 2014-06-07 DIAGNOSIS — H35359 Cystoid macular degeneration, unspecified eye: Secondary | ICD-10-CM | POA: Diagnosis not present

## 2014-06-07 DIAGNOSIS — H4011X0 Primary open-angle glaucoma, stage unspecified: Secondary | ICD-10-CM | POA: Diagnosis not present

## 2014-06-07 DIAGNOSIS — H2010 Chronic iridocyclitis, unspecified eye: Secondary | ICD-10-CM | POA: Diagnosis not present

## 2014-06-17 ENCOUNTER — Ambulatory Visit (INDEPENDENT_AMBULATORY_CARE_PROVIDER_SITE_OTHER): Payer: Medicare Other

## 2014-06-17 ENCOUNTER — Ambulatory Visit (INDEPENDENT_AMBULATORY_CARE_PROVIDER_SITE_OTHER): Payer: Medicare Other | Admitting: Family Medicine

## 2014-06-17 VITALS — BP 148/60 | HR 83 | Temp 98.4°F | Resp 16 | Ht 71.25 in | Wt 191.2 lb

## 2014-06-17 DIAGNOSIS — H2010 Chronic iridocyclitis, unspecified eye: Secondary | ICD-10-CM | POA: Diagnosis not present

## 2014-06-17 DIAGNOSIS — R059 Cough, unspecified: Secondary | ICD-10-CM | POA: Diagnosis not present

## 2014-06-17 DIAGNOSIS — J209 Acute bronchitis, unspecified: Secondary | ICD-10-CM | POA: Diagnosis not present

## 2014-06-17 DIAGNOSIS — R05 Cough: Secondary | ICD-10-CM | POA: Diagnosis not present

## 2014-06-17 DIAGNOSIS — R9389 Abnormal findings on diagnostic imaging of other specified body structures: Secondary | ICD-10-CM | POA: Diagnosis not present

## 2014-06-17 DIAGNOSIS — H4060X0 Glaucoma secondary to drugs, unspecified eye, stage unspecified: Secondary | ICD-10-CM | POA: Diagnosis not present

## 2014-06-17 DIAGNOSIS — T380X5A Adverse effect of glucocorticoids and synthetic analogues, initial encounter: Secondary | ICD-10-CM | POA: Diagnosis not present

## 2014-06-17 MED ORDER — AZITHROMYCIN 250 MG PO TABS: ORAL_TABLET | ORAL | Status: DC

## 2014-06-17 MED ORDER — HYDROCODONE-HOMATROPINE 5-1.5 MG/5ML PO SYRP: 5.0000 mL | ORAL_SOLUTION | Freq: Three times a day (TID) | ORAL | Status: DC | PRN

## 2014-06-17 NOTE — Progress Notes (Signed)
Subjective:    Patient ID: Geoffrey Bennett, male    DOB: Aug 23, 1922, 80 y.o.   MRN: [id number]  Cough   Chief Complaint  Patient presents with   Cough    X 2 DAYS    This chart was scribed for Robyn Haber, MD by Thea Alken, ED Scribe. This patient was seen in room 11 and the patient's care was started at 1:15 PM.  HPI Comments: Geoffrey Bennett is a 80 y.o. male who presents to the [facility] complaining of a wet cough x 2 days. Pt states cough began with a sore throat x 3 days ago. He states he was being  seen at his optometrist PTA who sent him to the facility to be seen after hearing the cough.   Patient Active Problem List   Diagnosis Date Noted   Aortic insufficiency 08/25/2013   Arrhythmia 08/25/2013   Arrhythmia, atrial 07/28/2013   Hyperlipidemia 07/28/2013   DM (diabetes mellitus) 03/22/2013   HTN (hypertension) 03/22/2013   BPH (benign prostatic hyperplasia) 03/22/2013   Hearing loss 03/22/2013   Past Medical History  Diagnosis Date   Cataract    Diabetes mellitus without complication    Glaucoma    Hypertension    Retinal detachment     of left eye with laser surgery   prostate ca dx'd 2008    seed implant   No Known Allergies Prior to Admission medications   Medication Sig Start Date End Date Taking? Authorizing Provider  ALPHAGAN P 0.1 % SOLN Apply 2 drops to eye 2 (two) times daily. 07/13/13  Yes Historical Provider, MD  aspirin 81 MG tablet Take 81 mg by mouth daily.   Yes Historical Provider, MD  atorvastatin (LIPITOR) 20 MG tablet Take 20 mg by mouth daily.   Yes Historical Provider, MD  cholecalciferol (VITAMIN D) 1000 UNITS tablet Take 1,000 Units by mouth daily.   Yes Historical Provider, MD  fish oil-omega-3 fatty acids 1000 MG capsule Take 2 g by mouth daily.   Yes Historical Provider, MD  furosemide (LASIX) 20 MG tablet Take 20 mg by mouth daily.   Yes Historical Provider, MD  losartan (COZAAR) 50 MG tablet  Take 50 mg by mouth daily. 06/19/13  Yes Historical Provider, MD  LOTEMAX 0.5 % GEL 2 (two) times daily. 06/18/13  Yes Historical Provider, MD  LUMIGAN 0.01 % SOLN 1 drop 2 (two) times daily. 06/10/13  Yes Historical Provider, MD  omega-3 acid ethyl esters (LOVAZA) 1 G capsule Take 2 g by mouth 2 (two) times daily.   Yes Historical Provider, MD  Saxagliptin-Metformin (KOMBIGLYZE XR) 04-999 MG TB24 Take 1 tablet by mouth daily.    Yes Historical Provider, MD  timolol (TIMOPTIC) 0.5 % ophthalmic solution Apply 1 drop to eye 2 (two) times daily. 06/18/13  Yes Historical Provider, MD  finasteride (PROSCAR) 5 MG tablet Take 5 mg by mouth daily. 07/26/13   Historical Provider, MD   Review of Systems  Respiratory: Positive for cough.       Objective:   Physical Exam  Nursing note and vitals reviewed. Constitutional: He is oriented to person, place, and time. He appears well-developed and well-nourished. No distress.  HENT:  Head: Normocephalic and atraumatic.  Eyes: Conjunctivae and EOM are normal.  Neck: Neck supple. No tracheal deviation present.  Cardiovascular: Normal rate.   Pulmonary/Chest: Effort normal. No respiratory distress. He has wheezes ( expiratory bilaterally).  Musculoskeletal: Normal range of motion. He exhibits  no tenderness.  Neurological: He is alert and oriented to person, place, and time.  Skin: Skin is warm and dry.  Psychiatric: He has a normal mood and affect. His behavior is normal.   [facility] reading (PRIMARY). Dr. Joseph Art hazy left lower lobe   Assessment & Plan:   Cough - Plan: DG Chest 2 View, azithromycin (ZITHROMAX Z-PAK) 250 MG tablet, HYDROcodone-homatropine (HYCODAN) 5-1.5 MG/5ML syrup  Acute bronchitis, unspecified organism - Plan: azithromycin (ZITHROMAX Z-PAK) 250 MG tablet, HYDROcodone-homatropine (HYCODAN) 5-1.5 MG/5ML syrup  Abnormal chest x-ray - Plan: azithromycin (ZITHROMAX Z-PAK) 250 MG tablet, HYDROcodone-homatropine (HYCODAN) 5-1.5 MG/5ML  syrup  Signed, Robyn Haber, MD

## 2014-06-24 ENCOUNTER — Ambulatory Visit (INDEPENDENT_AMBULATORY_CARE_PROVIDER_SITE_OTHER): Payer: Medicare Other | Admitting: Internal Medicine

## 2014-06-24 VITALS — BP 134/62 | HR 75 | Temp 98.2°F | Resp 18 | Ht 71.0 in | Wt 191.0 lb

## 2014-06-24 DIAGNOSIS — J45909 Unspecified asthma, uncomplicated: Secondary | ICD-10-CM | POA: Diagnosis not present

## 2014-06-24 DIAGNOSIS — R059 Cough, unspecified: Secondary | ICD-10-CM | POA: Diagnosis not present

## 2014-06-24 DIAGNOSIS — R05 Cough: Secondary | ICD-10-CM | POA: Diagnosis not present

## 2014-06-24 MED ORDER — DOXYCYCLINE HYCLATE 100 MG PO TABS
100.0000 mg | ORAL_TABLET | Freq: Two times a day (BID) | ORAL | Status: DC
Start: 2014-06-24 — End: 2014-07-27

## 2014-06-24 MED ORDER — ALBUTEROL SULFATE HFA 108 (90 BASE) MCG/ACT IN AERS: 2.0000 | INHALATION_SPRAY | Freq: Four times a day (QID) | RESPIRATORY_TRACT | Status: DC | PRN

## 2014-06-24 MED ORDER — BECLOMETHASONE DIPROPIONATE 80 MCG/ACT IN AERS: 2.0000 | INHALATION_SPRAY | Freq: Two times a day (BID) | RESPIRATORY_TRACT | Status: DC

## 2014-06-24 MED ORDER — PREDNISONE 20 MG PO TABS
ORAL_TABLET | ORAL | Status: DC
Start: 2014-06-24 — End: 2014-07-27

## 2014-06-24 MED ORDER — ALBUTEROL SULFATE (2.5 MG/3ML) 0.083% IN NEBU
2.5000 mg | INHALATION_SOLUTION | Freq: Once | RESPIRATORY_TRACT | Status: AC
  Administered 2014-06-24: 2.5 mg via RESPIRATORY_TRACT

## 2014-06-24 NOTE — Progress Notes (Addendum)
Subjective:    Patient ID: Geoffrey Bennett, male    DOB: Nov 11, 1922, 80 y.o.   MRN: [id number]  This chart was scribed for Tami Lin, MD by Erling Conte, Medical Scribe. This patient was seen in Room 11 and the patient's care was started at 8:57 AM.   HPI HPI Comments: Geoffrey Bennett is a 80 y.o. male with a h/o DM, HTN, and BHP who presents to the [facility] for a recheck from his visit on 6/26 with Dr. Robyn Haber. Patient was diagnosed acute bronchitis. He was prescribed a Z-pak and Hycodan. He was told to return if his cough persisted. Patient states that he still has a moderate productive cough but feels like prescribed cough medicine has been controlling the cough. Patient states that there has been a few times that he has noticed he has coughed up some blood. Patient states he has an associated rhinorrhea and mild shortness of breath but states that he has still been able to go to the gym and perform normal activities. Patient denies any fever. Patient denies any history of CHF asthma or copd.    Patient Active Problem List   Diagnosis Date Noted   Aortic insufficiency 08/25/2013   Arrhythmia 08/25/2013   Arrhythmia, atrial 07/28/2013   Hyperlipidemia 07/28/2013   DM (diabetes mellitus) 03/22/2013   HTN (hypertension) 03/22/2013   BPH (benign prostatic hyperplasia) 03/22/2013   Hearing loss 03/22/2013   Past Medical History  Diagnosis Date   Cataract    Diabetes mellitus without complication    Glaucoma    Hypertension    Retinal detachment     of left eye with laser surgery   prostate ca dx'd 2008    seed implant   Past Surgical History  Procedure Laterality Date   Eye surgery     Hernia repair     Joint replacement     Prostate surgery     No Known Allergies Prior to Admission medications   Medication Sig Start Date End Date Taking? Authorizing Provider  ALPHAGAN P 0.1 % SOLN Apply 2 drops to eye 2 (two) times  daily. 07/13/13  Yes Historical Provider, MD  aspirin 81 MG tablet Take 81 mg by mouth daily.   Yes Historical Provider, MD  atorvastatin (LIPITOR) 20 MG tablet Take 20 mg by mouth daily.   Yes Historical Provider, MD  azithromycin (ZITHROMAX Z-PAK) 250 MG tablet Take as directed on pack 06/17/14  Yes Robyn Haber, MD  cholecalciferol (VITAMIN D) 1000 UNITS tablet Take 1,000 Units by mouth daily.   Yes Historical Provider, MD  finasteride (PROSCAR) 5 MG tablet Take 5 mg by mouth daily. 07/26/13  Yes Historical Provider, MD  fish oil-omega-3 fatty acids 1000 MG capsule Take 2 g by mouth daily.   Yes Historical Provider, MD  furosemide (LASIX) 20 MG tablet Take 20 mg by mouth daily.   Yes Historical Provider, MD  HYDROcodone-homatropine (HYCODAN) 5-1.5 MG/5ML syrup Take 5 mLs by mouth every 8 (eight) hours as needed for cough. 06/17/14  Yes Robyn Haber, MD  losartan (COZAAR) 50 MG tablet Take 50 mg by mouth daily. 06/19/13  Yes Historical Provider, MD  LOTEMAX 0.5 % GEL 2 (two) times daily. 06/18/13  Yes Historical Provider, MD  LUMIGAN 0.01 % SOLN 1 drop 2 (two) times daily. 06/10/13  Yes Historical Provider, MD  omega-3 acid ethyl esters (LOVAZA) 1 G capsule Take 2 g by mouth 2 (two) times daily.   Yes  Historical Provider, MD  Saxagliptin-Metformin (KOMBIGLYZE XR) 04-999 MG TB24 Take 1 tablet by mouth daily.    Yes Historical Provider, MD  timolol (TIMOPTIC) 0.5 % ophthalmic solution Apply 1 drop to eye 2 (two) times daily. 06/18/13  Yes Historical Provider, MD   History   Social History   Marital Status: Married    Spouse Name: N/A    Number of Children: N/A   Years of Education: N/A   Occupational History   Not on file.   Social History Main Topics   Smoking status: Never Smoker    Smokeless tobacco: Not on file   Alcohol Use: No   Drug Use: No   Sexual Activity: No   Other Topics Concern   Not on file   Social History Narrative   No narrative on file      Review  of Systems  Constitutional: Negative for fever.  HENT: Positive for rhinorrhea.   Respiratory: Positive for cough and shortness of breath (mild).        Hemoptysis (1 or 2 times)  All other systems reviewed and are negative.      Objective:   Physical Exam  Nursing note and vitals reviewed. Constitutional: He is oriented to person, place, and time. He appears well-developed and well-nourished. No distress.  HENT:  Head: Normocephalic and atraumatic.  Eyes: EOM are normal.  L eye red conj//blind /detac ret  Neck: Neck supple. No thyromegaly present.  Cardiovascular: Normal rate, regular rhythm and normal heart sounds.  Exam reveals no gallop.   Cannot hear diastolic m  Pulmonary/Chest: Effort normal.  Audible wheezing forced expir bilat Rhonchi both bases   Musculoskeletal: Normal range of motion. He exhibits no edema.  Lymphadenopathy:    He has no cervical adenopathy.  Neurological: He is alert and oriented to person, place, and time.  Skin: Skin is warm and dry.  Psychiatric: He has a normal mood and affect. His behavior is normal.  BP 134/62   Pulse 75   Temp(Src) 98.2 F (36.8 C) (Oral)   Resp 18   Ht 5' 11"  (1.803 m)   Wt 191 lb (86.637 kg)   BMI 26.65 kg/m2   SpO2 93%   Neb w/ albut= greatly improved wheezing both subjectively and objectively Mild wheezing with forced expiration remains      Assessment & Plan:  Reactive airway disease secondary to acute bronchial infection of some sort with limited response to Zithromax  Meds ordered this encounter  Medications   albuterol (PROVENTIL) (2.5 MG/3ML) 0.083% nebulizer solution 2.5 mg    Sig:    doxycycline (VIBRA-TABS) 100 MG tablet    Sig: Take 1 tablet (100 mg total) by mouth 2 (two) times daily. Don't lie down for 60 minutes after taking    Dispense:  20 tablet    Refill:  0   beclomethasone (QVAR) 80 MCG/ACT inhaler    Sig: Inhale 2 puffs into the lungs 2 (two) times daily. Use for 1 month to prevent  wheezing    Dispense:  1 Inhaler    Refill:  12   albuterol (PROVENTIL HFA;VENTOLIN HFA) 108 (90 BASE) MCG/ACT inhaler    Sig: Inhale 2 puffs into the lungs every 6 (six) hours as needed for wheezing or shortness of breath. Use regularly for 7 days then as needed    Dispense:  1 Inhaler    Refill:  2   predniSONE (DELTASONE) 20 MG tablet    Sig: 2 tabs today and  2 tomorrow as single dose    Dispense:  4 tablet    Refill:  0   Followup in one week to reassess and in one month with primary care to rule out underlying asthma or COPD and repeat x-ray

## 2014-06-24 NOTE — Patient Instructions (Signed)
Need repeat evaluation with chest xray in 30 days([facility])

## 2014-06-28 DIAGNOSIS — E119 Type 2 diabetes mellitus without complications: Secondary | ICD-10-CM | POA: Diagnosis not present

## 2014-06-28 DIAGNOSIS — E7800 Pure hypercholesterolemia, unspecified: Secondary | ICD-10-CM | POA: Diagnosis not present

## 2014-06-29 DIAGNOSIS — H2010 Chronic iridocyclitis, unspecified eye: Secondary | ICD-10-CM | POA: Diagnosis not present

## 2014-06-29 DIAGNOSIS — H211X9 Other vascular disorders of iris and ciliary body, unspecified eye: Secondary | ICD-10-CM | POA: Diagnosis not present

## 2014-06-29 DIAGNOSIS — H4011X0 Primary open-angle glaucoma, stage unspecified: Secondary | ICD-10-CM | POA: Diagnosis not present

## 2014-06-30 DIAGNOSIS — J189 Pneumonia, unspecified organism: Secondary | ICD-10-CM | POA: Diagnosis not present

## 2014-06-30 DIAGNOSIS — J9819 Other pulmonary collapse: Secondary | ICD-10-CM | POA: Diagnosis not present

## 2014-06-30 DIAGNOSIS — I10 Essential (primary) hypertension: Secondary | ICD-10-CM | POA: Diagnosis not present

## 2014-06-30 DIAGNOSIS — E119 Type 2 diabetes mellitus without complications: Secondary | ICD-10-CM | POA: Diagnosis not present

## 2014-06-30 DIAGNOSIS — E7800 Pure hypercholesterolemia, unspecified: Secondary | ICD-10-CM | POA: Diagnosis not present

## 2014-07-13 ENCOUNTER — Ambulatory Visit (INDEPENDENT_AMBULATORY_CARE_PROVIDER_SITE_OTHER): Payer: Medicare Other | Admitting: Family Medicine

## 2014-07-13 VITALS — BP 132/68 | HR 80 | Temp 98.3°F | Resp 18 | Ht 72.0 in | Wt 191.0 lb

## 2014-07-13 DIAGNOSIS — H6120 Impacted cerumen, unspecified ear: Secondary | ICD-10-CM

## 2014-07-13 DIAGNOSIS — H9209 Otalgia, unspecified ear: Secondary | ICD-10-CM | POA: Diagnosis not present

## 2014-07-13 DIAGNOSIS — R062 Wheezing: Secondary | ICD-10-CM | POA: Diagnosis not present

## 2014-07-13 DIAGNOSIS — H6121 Impacted cerumen, right ear: Secondary | ICD-10-CM

## 2014-07-13 DIAGNOSIS — H9201 Otalgia, right ear: Secondary | ICD-10-CM

## 2014-07-13 NOTE — Progress Notes (Signed)
[facility] [address] [place] [address] [phone]  Date:  07/13/2014   Name:  Geoffrey Bennett   DOB:  06-01-1922   MRN:  [id number]  PCP:  Horatio Pel, MD    Chief Complaint: Sore Throat, Otalgia and Cough   History of Present Illness:  Geoffrey Bennett is a 80 y.o. very pleasant male patient who presents with the following:  Seen late June and treated for bronchitis with azithromycin and hycodan. He then returned on 7/3 and we added doxycycline, qvar, prednisone and albuterol.  He finished the doxycycline about one week ago.   He is here today with a ST;notes that the right side of her throat and right ear are painful He also still does have some cough.    Patient Active Problem List   Diagnosis Date Noted  . Aortic insufficiency 08/25/2013  . Arrhythmia 08/25/2013  . Arrhythmia, atrial 07/28/2013  . Hyperlipidemia 07/28/2013  . DM (diabetes mellitus) 03/22/2013  . HTN (hypertension) 03/22/2013  . BPH (benign prostatic hyperplasia) 03/22/2013  . Hearing loss 03/22/2013    Past Medical History  Diagnosis Date  . Cataract   . Diabetes mellitus without complication   . Glaucoma   . Hypertension   . Retinal detachment     of left eye with laser surgery  . prostate ca dx'd 2008    seed implant    Past Surgical History  Procedure Laterality Date  . Eye surgery    . Hernia repair    . Joint replacement    . Prostate surgery      History  Substance Use Topics  . Smoking status: Never Smoker   . Smokeless tobacco: Not on file  . Alcohol Use: No    Family History  Problem Relation Age of Onset  . Heart disease Mother   . Cancer Father     No Known Allergies  Medication list has been reviewed and updated.  Current Outpatient Prescriptions on File Prior to Visit  Medication Sig Dispense Refill  . albuterol (PROVENTIL HFA;VENTOLIN HFA) 108 (90 BASE) MCG/ACT inhaler Inhale 2 puffs into the lungs every 6 (six) hours as  needed for wheezing or shortness of breath. Use regularly for 7 days then as needed  1 Inhaler  2  . ALPHAGAN P 0.1 % SOLN Apply 2 drops to eye 2 (two) times daily.      Marland Kitchen aspirin 81 MG tablet Take 81 mg by mouth daily.      Marland Kitchen atorvastatin (LIPITOR) 20 MG tablet Take 20 mg by mouth daily.      Marland Kitchen azithromycin (ZITHROMAX Z-PAK) 250 MG tablet Take as directed on pack  6 tablet  0  . cholecalciferol (VITAMIN D) 1000 UNITS tablet Take 1,000 Units by mouth daily.      Marland Kitchen doxycycline (VIBRA-TABS) 100 MG tablet Take 1 tablet (100 mg total) by mouth 2 (two) times daily. Don't lie down for 60 minutes after taking  20 tablet  0  . finasteride (PROSCAR) 5 MG tablet Take 5 mg by mouth daily.      . fish oil-omega-3 fatty acids 1000 MG capsule Take 2 g by mouth daily.      . furosemide (LASIX) 20 MG tablet Take 20 mg by mouth daily.      Marland Kitchen losartan (COZAAR) 50 MG tablet Take 50 mg by mouth daily.      Marland Kitchen LOTEMAX 0.5 % GEL 2 (two) times daily.      Marland Kitchen  LUMIGAN 0.01 % SOLN 1 drop 2 (two) times daily.      Marland Kitchen omega-3 acid ethyl esters (LOVAZA) 1 G capsule Take 2 g by mouth 2 (two) times daily.      . Saxagliptin-Metformin (KOMBIGLYZE XR) 04-999 MG TB24 Take 1 tablet by mouth daily.       . timolol (TIMOPTIC) 0.5 % ophthalmic solution Apply 1 drop to eye 2 (two) times daily.      . beclomethasone (QVAR) 80 MCG/ACT inhaler Inhale 2 puffs into the lungs 2 (two) times daily. Use for 1 month to prevent wheezing  1 Inhaler  12  . HYDROcodone-homatropine (HYCODAN) 5-1.5 MG/5ML syrup Take 5 mLs by mouth every 8 (eight) hours as needed for cough.  120 mL  0  . predniSONE (DELTASONE) 20 MG tablet 2 tabs today and  2 tomorrow as single dose  4 tablet  0   No current facility-administered medications on file prior to visit.    Review of Systems:  As per HPI- otherwise negative.   Physical Examination: Filed Vitals:   07/13/14 1203  BP: 132/68  Pulse: 80  Temp: 98.3 F (36.8 C)  Resp: 18   Filed Vitals:    07/13/14 1203  Height: 6' (1.829 m)  Weight: 191 lb (86.637 kg)   Body mass index is 25.9 kg/(m^2). Ideal Body Weight: Weight in (lb) to have BMI = 25: 183.9  GEN: WDWN, NAD, Non-toxic, A & O x 3 HEENT: Atraumatic, Normocephalic. Neck supple. No masses, No LAD.  Right ear canal is occluded with cerumen.  Left is normal.  Oropharynx wnl, PEERL Ears and Nose: No external deformity. CV: RRR, No M/G/R. No JVD. No thrill. No extra heart sounds. PULM: CTA B, no wheezes, crackles, rhonchi. No retractions. No resp. distress. No accessory muscle use.  He can induce expiratory wheezing by expiring forcefully EXTR: No c/c/e NEURO Normal gait.  PSYCH: Normally interactive. Conversant. Not depressed or anxious appearing.  Calm demeanor.   irrigated and used alligators and curette to remove cerumen from right ear.  Removed a very large wax plug from the right ear.  He felt pain resolve.  He did have a small amount of bleeding from the right ear canal after procedure but TM is intact and undamaged  Assessment and Plan: Cerumen impaction, right  Wheezing  Ear pain, right  Treated as above.  Ear pain resolved.  He will refill his albuterol to use as needed for wheezing   Signed Lamar Blinks, MD

## 2014-07-13 NOTE — Patient Instructions (Signed)
Try using some albuterol for your wheezing.  Let me know if you have any other issues with your ear.

## 2014-07-15 DIAGNOSIS — H4011X0 Primary open-angle glaucoma, stage unspecified: Secondary | ICD-10-CM | POA: Diagnosis not present

## 2014-07-15 DIAGNOSIS — H2010 Chronic iridocyclitis, unspecified eye: Secondary | ICD-10-CM | POA: Diagnosis not present

## 2014-07-18 DIAGNOSIS — R0989 Other specified symptoms and signs involving the circulatory and respiratory systems: Secondary | ICD-10-CM | POA: Diagnosis not present

## 2014-07-18 DIAGNOSIS — C61 Malignant neoplasm of prostate: Secondary | ICD-10-CM | POA: Diagnosis not present

## 2014-07-18 DIAGNOSIS — R350 Frequency of micturition: Secondary | ICD-10-CM | POA: Diagnosis not present

## 2014-07-19 ENCOUNTER — Other Ambulatory Visit: Payer: Self-pay | Admitting: Endocrinology

## 2014-07-19 DIAGNOSIS — R911 Solitary pulmonary nodule: Secondary | ICD-10-CM

## 2014-07-21 ENCOUNTER — Ambulatory Visit
Admission: RE | Admit: 2014-07-21 | Discharge: 2014-07-21 | Disposition: A | Discharge status: Home / Self Care | Payer: Medicare Other | Source: Ambulatory Visit | Attending: Endocrinology | Admitting: Endocrinology

## 2014-07-21 DIAGNOSIS — J984 Other disorders of lung: Secondary | ICD-10-CM | POA: Diagnosis not present

## 2014-07-21 DIAGNOSIS — R911 Solitary pulmonary nodule: Secondary | ICD-10-CM

## 2014-07-21 MED ORDER — IOHEXOL 300 MG/ML  SOLN: 100.0000 mL | Freq: Once | INTRAMUSCULAR | Status: AC | PRN

## 2014-07-22 DIAGNOSIS — R350 Frequency of micturition: Secondary | ICD-10-CM | POA: Diagnosis not present

## 2014-07-22 DIAGNOSIS — C61 Malignant neoplasm of prostate: Secondary | ICD-10-CM | POA: Diagnosis not present

## 2014-07-25 DIAGNOSIS — E7800 Pure hypercholesterolemia, unspecified: Secondary | ICD-10-CM | POA: Diagnosis not present

## 2014-07-25 DIAGNOSIS — Z125 Encounter for screening for malignant neoplasm of prostate: Secondary | ICD-10-CM | POA: Diagnosis not present

## 2014-07-25 DIAGNOSIS — I10 Essential (primary) hypertension: Secondary | ICD-10-CM | POA: Diagnosis not present

## 2014-07-25 DIAGNOSIS — E119 Type 2 diabetes mellitus without complications: Secondary | ICD-10-CM | POA: Diagnosis not present

## 2014-07-25 DIAGNOSIS — Z0000 Encounter for general adult medical examination without abnormal findings: Secondary | ICD-10-CM | POA: Diagnosis not present

## 2014-07-27 ENCOUNTER — Encounter: Payer: Self-pay | Admitting: Internal Medicine

## 2014-07-27 ENCOUNTER — Ambulatory Visit (INDEPENDENT_AMBULATORY_CARE_PROVIDER_SITE_OTHER): Payer: Medicare Other | Admitting: Internal Medicine

## 2014-07-27 VITALS — BP 128/72 | HR 73 | Temp 98.5°F | Ht 71.0 in | Wt 186.0 lb

## 2014-07-27 DIAGNOSIS — R918 Other nonspecific abnormal finding of lung field: Secondary | ICD-10-CM | POA: Diagnosis not present

## 2014-07-27 NOTE — Assessment & Plan Note (Addendum)
Although there are clearly abnormalities on CT scan, they should probably be considered "microscopic" since not obvious on plain cxr .     In the setting of obvious "macroscopic" health issues,  The most significant of which is age of  21  , I'm very reluctant to consider a w/u here in absence of a "lead" like a known primary tumor or any respiratory symptoms to suggest infection or malignancy which are both completely lacking now.  He very well could have occult met dz but doubt an "earrly dx" will be of any help at all in terms of his morbidity or mortality at this point   Discussed in detail all the  indications, usual  risks and alternatives  relative to the benefits with patient and daugher who agree to proceed with conservative w/u with cxr's only, at 3 m then q 6 - sooner for any resp symptoms  In meantime rec cpx with psa since does have h/o prostate ca

## 2014-07-27 NOTE — Progress Notes (Signed)
   Subjective:    Patient ID: Geoffrey Bennett, male    DOB: 06/10/1922   MRN: [id number]  HPI  79 yowm never a regular smoker referred  To pulmonary clinic by Dr Shelia Media  07/27/2014 for eval of spn on CT from 06/17/14 .   07/27/2014 1st [facility] office visit/ Kyle Stansell  Chief Complaint  Patient presents with  . Pulmonary Consult    Referred per Dr. Anda Kraft for eval of pulmonary nodule. Pt states that he was seen at [facility]/17/14 for cough and cxr was done at that time. Cough started in early June 2015 and has already resolved.   his baseline cxr is near nl and really shows nothing at all nodular and the cxr that promted the ct showed min atx L base in setting of apparent uri which pt says is 100% resolved with no residual cough, sob.    No obvious day to day or daytime variabilty or assoc chronic cough or cp or chest tightness, subjective wheeze overt sinus or hb symptoms. No unusual exp hx or h/o childhood pna/ asthma or knowledge of premature birth.  Sleeping ok without nocturnal  or early am exacerbation  of respiratory  c/o's or need for noct saba. Also denies any obvious fluctuation of symptoms with weather or environmental changes or other aggravating or alleviating factors except as outlined above   Current Medications, Allergies, Complete Past Medical History, Past Surgical History, Family History, and Social History were reviewed in [employer] record.       Review of Systems  Constitutional: Negative for fever, chills, activity change, appetite change and unexpected weight change.  HENT: Negative for congestion, dental problem, postnasal drip, rhinorrhea, sneezing, sore throat, trouble swallowing and voice change.   Eyes: Negative for visual disturbance.  Respiratory: Negative for cough, choking and shortness of breath.   Cardiovascular: Negative for chest pain and leg swelling.  Gastrointestinal: Negative for nausea, vomiting and abdominal pain.    Genitourinary: Negative for difficulty urinating.  Musculoskeletal: Negative for arthralgias.  Skin: Negative for rash.  Psychiatric/Behavioral: Negative for behavioral problems and confusion.       Objective:   Physical Exam  slt hoarse amb wm nad  Wt Readings from Last 3 Encounters:  07/27/14 186 lb (84.369 kg)  07/13/14 191 lb (86.637 kg)  06/24/14 191 lb (86.637 kg)      HEENT: nl dentition, turbinates, and orophanx. Nl external ear canals without cough reflex   NECK :  without JVD/Nodes/TM/ nl carotid upstrokes bilaterally   LUNGS: no acc muscle use, clear to A and P bilaterally without cough on insp or exp maneuvers   CV:  RRR  no s3 or murmur or increase in P2, no edema   ABD:  soft and nontender with nl excursion in the supine position. No bruits or organomegaly, bowel sounds nl  MS:  warm without deformities, calf tenderness, cyanosis or clubbing  SKIN: warm and dry without lesions    NEURO:  alert, approp, no deficits    cxr  06/17/14  No active cardiopulmonary disease. Scarring versus atelectasis,  minimal, left lung base.  07/21/14 CT no contrast Multiple diffuse right-sided pulmonary nodules. Differential  considerations include metastasis, intrathoracic pulmonary  metastatic disease, or less likely sequela of granulomatous disease  or other benign infection or inflammation          Assessment & Plan:

## 2014-07-27 NOTE — Patient Instructions (Signed)
Please schedule a follow up visit in 3 months but call sooner if needed with cxr on return unless unexplained cough/ weight loss/ short of breath

## 2014-07-28 DIAGNOSIS — E7800 Pure hypercholesterolemia, unspecified: Secondary | ICD-10-CM | POA: Diagnosis not present

## 2014-07-28 DIAGNOSIS — R05 Cough: Secondary | ICD-10-CM | POA: Diagnosis not present

## 2014-07-28 DIAGNOSIS — I10 Essential (primary) hypertension: Secondary | ICD-10-CM | POA: Diagnosis not present

## 2014-07-28 DIAGNOSIS — R059 Cough, unspecified: Secondary | ICD-10-CM | POA: Diagnosis not present

## 2014-07-28 DIAGNOSIS — H9209 Otalgia, unspecified ear: Secondary | ICD-10-CM | POA: Diagnosis not present

## 2014-07-28 DIAGNOSIS — E875 Hyperkalemia: Secondary | ICD-10-CM | POA: Diagnosis not present

## 2014-07-28 DIAGNOSIS — R918 Other nonspecific abnormal finding of lung field: Secondary | ICD-10-CM | POA: Diagnosis not present

## 2014-09-09 ENCOUNTER — Ambulatory Visit (INDEPENDENT_AMBULATORY_CARE_PROVIDER_SITE_OTHER): Payer: Medicare Other

## 2014-09-09 ENCOUNTER — Ambulatory Visit (INDEPENDENT_AMBULATORY_CARE_PROVIDER_SITE_OTHER): Payer: Medicare Other | Admitting: Internal Medicine

## 2014-09-09 VITALS — BP 152/88 | HR 83 | Temp 97.2°F | Resp 17 | Ht 72.0 in | Wt 185.0 lb

## 2014-09-09 DIAGNOSIS — M79609 Pain in unspecified limb: Secondary | ICD-10-CM

## 2014-09-09 DIAGNOSIS — R269 Unspecified abnormalities of gait and mobility: Secondary | ICD-10-CM | POA: Diagnosis not present

## 2014-09-09 DIAGNOSIS — R109 Unspecified abdominal pain: Secondary | ICD-10-CM

## 2014-09-09 DIAGNOSIS — R2681 Unsteadiness on feet: Secondary | ICD-10-CM

## 2014-09-09 DIAGNOSIS — M549 Dorsalgia, unspecified: Secondary | ICD-10-CM

## 2014-09-09 DIAGNOSIS — M79604 Pain in right leg: Principal | ICD-10-CM

## 2014-09-09 LAB — POCT URINALYSIS DIPSTICK
Bilirubin, UA: NEGATIVE
Blood, UA: NEGATIVE
Glucose, UA: 250
Leukocytes, UA: NEGATIVE
NITRITE UA: NEGATIVE
Protein, UA: NEGATIVE
SPEC GRAV UA: 1.015
UROBILINOGEN UA: 0.2
pH, UA: 5

## 2014-09-09 LAB — POCT UA - MICROSCOPIC ONLY
BACTERIA, U MICROSCOPIC: NEGATIVE
Casts, Ur, LPF, POC: NEGATIVE
Crystals, Ur, HPF, POC: NEGATIVE
Epithelial cells, urine per micros: NEGATIVE
Mucus, UA: NEGATIVE
RBC, urine, microscopic: NEGATIVE
WBC, UR, HPF, POC: NEGATIVE
Yeast, UA: NEGATIVE

## 2014-09-09 MED ORDER — METHOCARBAMOL 750 MG PO TABS: 750.0000 mg | ORAL_TABLET | Freq: Four times a day (QID) | ORAL | Status: DC

## 2014-09-09 NOTE — Patient Instructions (Signed)
Flank Pain °Flank pain refers to pain that is located on the side of the body between the upper abdomen and the back. The pain may occur over a short period of time (acute) or may be long-term or reoccurring (chronic). It may be mild or severe. Flank pain can be caused by many things. °CAUSES  °Some of the more common causes of flank pain include: °· Muscle strains.   °· Muscle spasms.   °· A disease of your spine (vertebral disk disease).   °· A lung infection (pneumonia).   °· Fluid around your lungs (pulmonary edema).   °· A kidney infection.   °· Kidney stones.   °· A very painful skin rash caused by the chickenpox virus (shingles).   °· Gallbladder disease.   °HOME CARE INSTRUCTIONS  °Home care will depend on the cause of your pain. In general, °· Rest as directed by your caregiver. °· Drink enough fluids to keep your urine clear or pale yellow. °· Only take over-the-counter or prescription medicines as directed by your caregiver. Some medicines may help relieve the pain. °· Tell your caregiver about any changes in your pain. °· Follow up with your caregiver as directed. °SEEK IMMEDIATE MEDICAL CARE IF:  °· Your pain is not controlled with medicine.   °· You have new or worsening symptoms. °· Your pain increases.   °· You have abdominal pain.   °· You have shortness of breath.   °· You have persistent nausea or vomiting.   °· You have swelling in your abdomen.   °· You feel faint or pass out.   °· You have blood in your urine. °· You have a fever or persistent symptoms for more than 2-3 days. °· You have a fever and your symptoms suddenly get worse. °MAKE SURE YOU:  °· Understand these instructions. °· Will watch your condition. °· Will get help right away if you are not doing well or get worse. °Document Released: 01/30/2006 Document Revised: 09/02/2012 Document Reviewed: 07/23/2012 °ExitCare® Patient Information ©2015 [employer]. This information is not intended to replace advice given to you by your  health care provider. Make sure you discuss any questions you have with your health care provider. ° °

## 2014-09-09 NOTE — Progress Notes (Signed)
   Subjective:    Patient ID: Geoffrey Bennett, male    DOB: 03-11-1922, 80 y.o.   MRN: [id number]  HPI Patient is being seen for back pain. The pain since last Friday and more progress since Sunday. He has lower right of the back. Pain with movement. Walking up the steps, patient describe pain as sharp. He describe it is more painful to step with right leg.He goes to the gym 5 days a week. Do not use free weights. Have not gone to the gym for the last 6 days. No pain when bending to the knees. No numbness down the legs. No pain down the legsNo chills. No fever. Taken advil BID. No stomach pain. No problem going to the bathroom. Eating habit change in the last few days, lost appetite.   Patient had prostate cancer 20 years ago. Patient has hypertension, hyperlipidemia, and diabetes. Laser surgery in the left eye 3 years ago.    Review of Systems     Objective:   Physical Exam  Constitutional: He is oriented to person, place, and time. He appears well-developed and well-nourished. No distress.  HENT:  Head: Normocephalic.  Nose: Nose normal.  Eyes: EOM are normal. Pupils are equal, round, and reactive to light. No scleral icterus.  Neck: Normal range of motion. Neck supple.  Cardiovascular: Normal rate.   Pulmonary/Chest: Effort normal.  Abdominal: There is no tenderness.  Musculoskeletal:       Lumbar back: He exhibits tenderness, edema, pain and spasm. He exhibits normal range of motion, no bony tenderness, no swelling and normal pulse.       Back:  scoliosis  Neurological: He is alert and oriented to person, place, and time. He has normal strength and normal reflexes. No cranial nerve deficit or sensory deficit. He exhibits normal muscle tone. Coordination and gait abnormal.  Skin: No rash noted.    [facility] reading (PRIMARY) by  Dr.Saya Mccoll marked DDD and spondylosis CCUA pending  Results for orders placed in visit on 09/09/14  POCT URINALYSIS DIPSTICK      Result Value Ref Range   Color, UA yellow     Clarity, UA clear     Glucose, UA 250     Bilirubin, UA neg     Ketones, UA trace     Spec Grav, UA 1.015     Blood, UA neg     pH, UA 5.0     Protein, UA neg     Urobilinogen, UA 0.2     Nitrite, UA neg     Leukocytes, UA Negative    POCT UA - MICROSCOPIC ONLY      Result Value Ref Range   WBC, Ur, HPF, POC neg     RBC, urine, microscopic neg     Bacteria, U Microscopic neg     Mucus, UA neg     Epithelial cells, urine per micros neg     Crystals, Ur, HPF, POC neg     Casts, Ur, LPF, POC neg     Yeast, UA neg          Assessment & Plan:  Back care/Robaxin 750mg  TID/Tylenol

## 2014-09-15 DIAGNOSIS — H35359 Cystoid macular degeneration, unspecified eye: Secondary | ICD-10-CM | POA: Diagnosis not present

## 2014-09-15 DIAGNOSIS — H2010 Chronic iridocyclitis, unspecified eye: Secondary | ICD-10-CM | POA: Diagnosis not present

## 2014-09-15 DIAGNOSIS — H4011X0 Primary open-angle glaucoma, stage unspecified: Secondary | ICD-10-CM | POA: Diagnosis not present

## 2014-09-19 ENCOUNTER — Ambulatory Visit (INDEPENDENT_AMBULATORY_CARE_PROVIDER_SITE_OTHER): Payer: Medicare Other | Admitting: Cardiovascular Disease

## 2014-09-19 ENCOUNTER — Encounter: Payer: Self-pay | Admitting: Cardiovascular Disease

## 2014-09-19 VITALS — BP 146/58 | HR 83 | Resp 16 | Ht 72.0 in | Wt 188.2 lb

## 2014-09-19 DIAGNOSIS — I10 Essential (primary) hypertension: Secondary | ICD-10-CM | POA: Diagnosis not present

## 2014-09-19 DIAGNOSIS — I351 Nonrheumatic aortic (valve) insufficiency: Secondary | ICD-10-CM

## 2014-09-19 DIAGNOSIS — I359 Nonrheumatic aortic valve disorder, unspecified: Secondary | ICD-10-CM | POA: Diagnosis not present

## 2014-09-19 NOTE — Patient Instructions (Signed)
Dr.Croitoru wants you to follow-up in: 1 year. You will receive a reminder letter in the mail two months in advance. If you don't receive a letter, please call our office to schedule the follow-up appointment.  

## 2014-09-20 NOTE — Progress Notes (Signed)
Patient ID: Geoffrey Bennett, male   DOB: January 11, 1922, 80 y.o.   MRN: [id number]     Reason for office visit Arrhythmia followup  Geoffrey Bennett is an 80 year old gentleman with frequent premature atrial contractions and episodes of asymptomatic second degree atrial ventricular block Mobitz type I (primarily noticed during sleep). Also has hyperlipidemia on statin therapy, hypertension, type 2 diabetes mellitus on oral antidiabetic and takes occasional diuretics for ankle edema. He has mild aortic insufficiency by echocardiography. He has normal left ventricular systolic function.  Since his last appointment he continues to exercise at the gym 5 days a week, although he is somewhat limited now by back pain. Labs were recently performed by Dr.Pharr. He was diagnosed with nodules in his right lung on CT of the chest, but Dr. Melvyn Novas has evaluated him and recommended conservative management.   No Known Allergies  Current Outpatient Prescriptions  Medication Sig Dispense Refill  . ALPHAGAN P 0.1 % SOLN Apply 2 drops to eye 2 (two) times daily.      Marland Kitchen aspirin 81 MG tablet Take 81 mg by mouth daily.      Marland Kitchen atorvastatin (LIPITOR) 20 MG tablet Take 20 mg by mouth daily.      . cholecalciferol (VITAMIN D) 1000 UNITS tablet Take 1,000 Units by mouth daily.      . finasteride (PROSCAR) 5 MG tablet Take 5 mg by mouth daily.      . furosemide (LASIX) 20 MG tablet Take 20 mg by mouth daily.      Marland Kitchen losartan (COZAAR) 50 MG tablet Take 50 mg by mouth daily.      Marland Kitchen LOTEMAX 0.5 % GEL 2 (two) times daily.      Marland Kitchen LUMIGAN 0.01 % SOLN 1 drop 2 (two) times daily.      . methocarbamol (ROBAXIN-750) 750 MG tablet Take 1 tablet (750 mg total) by mouth 4 (four) times daily.  40 tablet  1  . niacin 500 MG tablet Take 500 mg by mouth at bedtime.      . Omega-3 Fatty Acids (FISH OIL) 1000 MG CAPS Take 2 capsules by mouth daily.      . prednisoLONE acetate (PRED FORTE) 1 % ophthalmic suspension Place 1 drop into both eyes as  directed.      . Saxagliptin-Metformin (KOMBIGLYZE XR) 04-999 MG TB24 Take 1 tablet by mouth daily.       . timolol (TIMOPTIC) 0.5 % ophthalmic solution Apply 1 drop to eye 2 (two) times daily.       No current facility-administered medications for this visit.    Past Medical History  Diagnosis Date  . Cataract   . Diabetes mellitus without complication   . Glaucoma   . Hypertension   . Retinal detachment     of left eye with laser surgery  . prostate ca dx'd 2008    seed implant    Past Surgical History  Procedure Laterality Date  . Eye surgery    . Hernia repair    . Joint replacement    . Prostate surgery      Family History  Problem Relation Age of Onset  . Heart disease Mother   . Cancer Father     History   Social History  . Marital Status: Married    Spouse Name: N/A    Number of Children: N/A  . Years of Education: N/A   Occupational History  . Not on file.   Social History Main Topics  .  Smoking status: Never Smoker   . Smokeless tobacco: Never Used  . Alcohol Use: No  . Drug Use: No  . Sexual Activity: No   Other Topics Concern  . Not on file   Social History Narrative  . No narrative on file    Review of systems: The patient specifically denies any chest pain at rest or with exertion, dyspnea at rest or with exertion, orthopnea, paroxysmal nocturnal dyspnea, syncope, palpitations, focal neurological deficits, intermittent claudication, lower extremity edema, unexplained weight gain, cough, hemoptysis or wheezing.  The patient also denies abdominal pain, nausea, vomiting, dysphagia, diarrhea, constipation, polyuria, polydipsia, dysuria, hematuria, frequency, urgency, abnormal bleeding or bruising, fever, chills, unexpected weight changes, mood swings, change in skin or hair texture, change in voice quality, auditory or visual problems, allergic reactions or rashes, new musculoskeletal complaints other than usual "aches and pains".   PHYSICAL  EXAM BP 146/58  Pulse 83  Resp 16  Ht 6' (1.829 m)  Wt 85.367 kg (188 lb 3.2 oz)  BMI 25.52 kg/m2 General: Alert, oriented x3, no distress  Head: no evidence of trauma, PERRL, EOMI, no exophtalmos or lid lag, no myxedema, no xanthelasma; normal ears, nose and oropharynx  Neck: normal jugular venous pulsations and no hepatojugular reflux; brisk carotid pulses without delay and no carotid bruits  Chest: clear to auscultation, no signs of consolidation by percussion or palpation, normal fremitus, symmetrical and full respiratory excursions  Cardiovascular: normal position and quality of the apical impulse, irregular rhythm, normal first and second heart sounds, no rubs or gallops, there is a very distinct decrescendo murmur heard best at the right and left lower sternal border radiating towards the base of the neck. It is holodiastolic.  Abdomen: no tenderness or distention, no masses by palpation, no abnormal pulsatility or arterial bruits, normal bowel sounds, no hepatosplenomegaly  Extremities: no clubbing, cyanosis or edema; 2+ radial, ulnar and brachial pulses bilaterally; 2+ right femoral, posterior tibial and dorsalis pedis pulses; 2+ left femoral, posterior tibial and dorsalis pedis pulses; no subclavian or femoral bruits  Neurological: grossly nonfocal   EKG: Sinus rhythm with premature atrial contractions, left anterior fascicular block  Lipid Panel  No results found for this basename: chol, trig, hdl, cholhdl, vldl, ldlcalc, ldldirect    BMET    Component Value Date/Time   NA 139 07/23/2010 1301   K 4.8 07/23/2010 1301   CL 106 07/23/2010 1301   CO2 28 07/23/2010 1301   GLUCOSE 125* 07/23/2010 1301   BUN 18 07/23/2010 1301   CREATININE 0.95 07/23/2010 1301   CALCIUM 9.1 07/23/2010 1301   GFRNONAA >60 07/23/2010 1301   GFRAA  Value: >60        The eGFR has been calculated using the MDRD equation. This calculation has not been validated in all clinical situations. eGFR's persistently <60  mL/min signify possible Chronic Kidney Disease. 07/23/2010 1301     ASSESSMENT AND PLAN Arrhythmia   His rhythm irregularity is related to frequent PACs as well a second-degree atrioventricular block, Mobitz type I. neither problem is symptomatic. He does not require pacemaker therapy at this time. It is preferable that we avoid medications with negative chronotropic affect.  Aortic insufficiency  Despite the rather prominent murmur on physical exam this is only mild in severity. Routine monitoring is not necessary unless he develops shortness of breath or other signs of congestive heart failure  HTN (hypertension)  Slightly high today, but usually in normal range on medication  Hyperlipidemia  On statin  therapy, labs monitored by Dr. Shelia Media  Orders Placed This Encounter  Procedures  . EKG 12-Lead   Patient Instructions  Dr Sallyanne Kuster wants you to follow-up in 1 year. You will receive a reminder letter in the mail two months in advance. If you don't receive a letter, please call our office to schedule the follow-up appointment.    Holli Humbles, MD, [place] [phone] office [phone] pager

## 2014-10-25 ENCOUNTER — Encounter: Payer: Self-pay | Admitting: Internal Medicine

## 2014-10-25 ENCOUNTER — Ambulatory Visit (INDEPENDENT_AMBULATORY_CARE_PROVIDER_SITE_OTHER): Payer: Medicare Other | Admitting: Internal Medicine

## 2014-10-25 ENCOUNTER — Ambulatory Visit (INDEPENDENT_AMBULATORY_CARE_PROVIDER_SITE_OTHER)
Admission: RE | Admit: 2014-10-25 | Discharge: 2014-10-25 | Disposition: A | Discharge status: Home / Self Care | Payer: Medicare Other | Source: Ambulatory Visit | Attending: Internal Medicine | Admitting: Internal Medicine

## 2014-10-25 DIAGNOSIS — R918 Other nonspecific abnormal finding of lung field: Secondary | ICD-10-CM

## 2014-10-25 NOTE — Progress Notes (Signed)
Subjective:    Patient ID: Geoffrey Bennett, male    DOB: 1922/03/02   MRN: [id number]   Brief patient profile:  77 yowm never a regular smoker referred  To pulmonary clinic by Dr Shelia Media  07/27/2014 for eval of spn on CT from 06/17/14 .    History of Present Illness  07/27/2014 1st [facility] office visit/ Geoffrey Bennett  Chief Complaint  Patient presents with  . Pulmonary Consult    Referred per Dr. Anda Kraft for eval of pulmonary nodule. Pt states that he was seen at [facility] 06/17/14 for cough and cxr was done at that time. Cough started in early June 2015 and has already resolved.   his baseline cxr is near nl and really shows nothing at all nodular and the cxr that promted the ct showed min atx L base in setting of apparent uri which pt says is 100% resolved with no residual cough, sob.  rec No change rx - f/u 3 m     10/25/2014 f/u ov/Lutisha Knoche re: mpns now asymptomatic Chief Complaint  Patient presents with  . Follow-up    Pt doing well and denies any new co's today.      Not limited by breathing from desired activities    No obvious day to day or daytime variabilty or assoc chronic cough or cp or chest tightness, subjective wheeze overt sinus or hb symptoms. No unusual exp hx or h/o childhood pna/ asthma or knowledge of premature birth.  Sleeping ok without nocturnal  or early am exacerbation  of respiratory  c/o's or need for noct saba. Also denies any obvious fluctuation of symptoms with weather or environmental changes or other aggravating or alleviating factors except as outlined above   Current Medications, Allergies, Complete Past Medical History, Past Surgical History, Family History, and Social History were reviewed in [employer] record.  ROS  The following are not active complaints unless bolded sore throat, dysphagia, dental problems, itching, sneezing,  nasal congestion or excess/ purulent secretions, ear ache,   fever, chills, sweats, unintended wt  loss, pleuritic or exertional cp, hemoptysis,  orthopnea pnd or leg swelling, presyncope, palpitations, heartburn, abdominal pain, anorexia, nausea, vomiting, diarrhea  or change in bowel or urinary habits, change in stools or urine, dysuria,hematuria,  rash, arthralgias, visual complaints, headache, numbness weakness or ataxia or problems with walking or coordination,  change in mood/affect or memory.                  Objective:   Physical Exam  slt hoarse amb robust elderly  wm nad  10/25/2014        189  Wt Readings from Last 3 Encounters:  07/27/14 186 lb (84.369 kg)  07/13/14 191 lb (86.637 kg)  06/24/14 191 lb (86.637 kg)      HEENT: nl dentition, turbinates, and orophanx. Nl external ear canals without cough reflex   NECK :  without JVD/Nodes/TM/ nl carotid upstrokes bilaterally   LUNGS: no acc muscle use, clear to A and P bilaterally without cough on insp or exp maneuvers   CV:  RRR  no s3 or murmur or increase in P2, trace pitting bilateral lower ext edema   ABD:  soft and nontender with nl excursion in the supine position. No bruits or organomegaly, bowel sounds nl  MS:  warm without deformities, calf tenderness, cyanosis or clubbing  SKIN: warm and dry without lesions    NEURO:  alert, approp, no deficits  07/21/14 CT no contrast Multiple diffuse right-sided pulmonary nodules. Differential  considerations include metastasis, intrathoracic pulmonary  metastatic disease, or less likely sequela of granulomatous disease  or other benign infection or inflammation    CXR  10/25/2014 :  Right upper lobe and right basilar nodules appear to have enlarged since prior chest x-ray and chest CT. Other right lung nodules are not as well visualized by plain film         Assessment & Plan:

## 2014-10-25 NOTE — Patient Instructions (Addendum)
Please schedule a follow up visit in 6 months but call sooner if needed  

## 2014-10-28 NOTE — Assessment & Plan Note (Signed)
07/21/14 CT no contrast Multiple diffuse right-sided pulmonary nodules. Differential  considerations include metastasis, intrathoracic pulmonary  metastatic disease, or less likely sequela of granulomatous disease  or other benign infection or inflammation - plain cxr 10/25/14 > def growth in at least 2 nodules on R    Most likely he has metastatic ca ? Primary but he is very reluctant to undergo any kind of bx or procedure at this point in his life as he has no symptoms at all and will soon be 89.   It is therefore reasonable to follow him expectantly for 6 m and then regroup unless he develops symptoms  I would track his PSA because treatment for met prostate ca eg hormonal might be something he would accept.

## 2014-11-02 DIAGNOSIS — E049 Nontoxic goiter, unspecified: Secondary | ICD-10-CM | POA: Diagnosis not present

## 2014-11-09 DIAGNOSIS — Z23 Encounter for immunization: Secondary | ICD-10-CM | POA: Diagnosis not present

## 2014-11-09 DIAGNOSIS — E118 Type 2 diabetes mellitus with unspecified complications: Secondary | ICD-10-CM | POA: Diagnosis not present

## 2014-11-09 DIAGNOSIS — E789 Disorder of lipoprotein metabolism, unspecified: Secondary | ICD-10-CM | POA: Diagnosis not present

## 2014-11-09 DIAGNOSIS — I10 Essential (primary) hypertension: Secondary | ICD-10-CM | POA: Diagnosis not present

## 2014-11-10 DIAGNOSIS — H2012 Chronic iridocyclitis, left eye: Secondary | ICD-10-CM | POA: Diagnosis not present

## 2014-11-10 DIAGNOSIS — H4050X3 Glaucoma secondary to other eye disorders, unspecified eye, severe stage: Secondary | ICD-10-CM | POA: Diagnosis not present

## 2014-11-10 DIAGNOSIS — H3532 Exudative age-related macular degeneration: Secondary | ICD-10-CM | POA: Diagnosis not present

## 2014-11-24 DIAGNOSIS — H2012 Chronic iridocyclitis, left eye: Secondary | ICD-10-CM | POA: Diagnosis not present

## 2014-11-24 DIAGNOSIS — H4011X1 Primary open-angle glaucoma, mild stage: Secondary | ICD-10-CM | POA: Diagnosis not present

## 2014-11-24 DIAGNOSIS — H4050X3 Glaucoma secondary to other eye disorders, unspecified eye, severe stage: Secondary | ICD-10-CM | POA: Diagnosis not present

## 2014-12-07 ENCOUNTER — Other Ambulatory Visit: Payer: Self-pay | Admitting: Urology

## 2014-12-07 DIAGNOSIS — C61 Malignant neoplasm of prostate: Secondary | ICD-10-CM

## 2014-12-14 DIAGNOSIS — H4050X3 Glaucoma secondary to other eye disorders, unspecified eye, severe stage: Secondary | ICD-10-CM | POA: Diagnosis not present

## 2014-12-14 DIAGNOSIS — H4011X1 Primary open-angle glaucoma, mild stage: Secondary | ICD-10-CM | POA: Diagnosis not present

## 2014-12-14 DIAGNOSIS — H16012 Central corneal ulcer, left eye: Secondary | ICD-10-CM | POA: Diagnosis not present

## 2014-12-14 DIAGNOSIS — H16002 Unspecified corneal ulcer, left eye: Secondary | ICD-10-CM | POA: Diagnosis not present

## 2014-12-15 DIAGNOSIS — H16012 Central corneal ulcer, left eye: Secondary | ICD-10-CM | POA: Diagnosis not present

## 2014-12-17 DIAGNOSIS — H16012 Central corneal ulcer, left eye: Secondary | ICD-10-CM | POA: Diagnosis not present

## 2014-12-19 DIAGNOSIS — H16012 Central corneal ulcer, left eye: Secondary | ICD-10-CM | POA: Diagnosis not present

## 2014-12-21 DIAGNOSIS — H16012 Central corneal ulcer, left eye: Secondary | ICD-10-CM | POA: Diagnosis not present

## 2014-12-24 DIAGNOSIS — H16012 Central corneal ulcer, left eye: Secondary | ICD-10-CM | POA: Diagnosis not present

## 2014-12-26 DIAGNOSIS — H16232 Neurotrophic keratoconjunctivitis, left eye: Secondary | ICD-10-CM | POA: Diagnosis not present

## 2014-12-26 DIAGNOSIS — H16012 Central corneal ulcer, left eye: Secondary | ICD-10-CM | POA: Diagnosis not present

## 2014-12-27 DIAGNOSIS — E118 Type 2 diabetes mellitus with unspecified complications: Secondary | ICD-10-CM | POA: Diagnosis not present

## 2014-12-27 DIAGNOSIS — E780 Pure hypercholesterolemia: Secondary | ICD-10-CM | POA: Diagnosis not present

## 2014-12-28 DIAGNOSIS — H01025 Squamous blepharitis left lower eyelid: Secondary | ICD-10-CM | POA: Diagnosis not present

## 2014-12-28 DIAGNOSIS — H01021 Squamous blepharitis right upper eyelid: Secondary | ICD-10-CM | POA: Diagnosis not present

## 2014-12-28 DIAGNOSIS — H16012 Central corneal ulcer, left eye: Secondary | ICD-10-CM | POA: Diagnosis not present

## 2014-12-28 DIAGNOSIS — H16232 Neurotrophic keratoconjunctivitis, left eye: Secondary | ICD-10-CM | POA: Diagnosis not present

## 2014-12-28 DIAGNOSIS — H01022 Squamous blepharitis right lower eyelid: Secondary | ICD-10-CM | POA: Diagnosis not present

## 2014-12-28 DIAGNOSIS — H01024 Squamous blepharitis left upper eyelid: Secondary | ICD-10-CM | POA: Diagnosis not present

## 2014-12-29 DIAGNOSIS — E119 Type 2 diabetes mellitus without complications: Secondary | ICD-10-CM | POA: Diagnosis not present

## 2014-12-29 DIAGNOSIS — I10 Essential (primary) hypertension: Secondary | ICD-10-CM | POA: Diagnosis not present

## 2014-12-29 DIAGNOSIS — E785 Hyperlipidemia, unspecified: Secondary | ICD-10-CM | POA: Diagnosis not present

## 2015-01-02 DIAGNOSIS — H1712 Central corneal opacity, left eye: Secondary | ICD-10-CM | POA: Diagnosis not present

## 2015-01-02 DIAGNOSIS — H16012 Central corneal ulcer, left eye: Secondary | ICD-10-CM | POA: Diagnosis not present

## 2015-01-11 DIAGNOSIS — H16232 Neurotrophic keratoconjunctivitis, left eye: Secondary | ICD-10-CM | POA: Diagnosis not present

## 2015-01-11 DIAGNOSIS — H16012 Central corneal ulcer, left eye: Secondary | ICD-10-CM | POA: Diagnosis not present

## 2015-01-16 DIAGNOSIS — H1711 Central corneal opacity, right eye: Secondary | ICD-10-CM | POA: Diagnosis not present

## 2015-01-16 DIAGNOSIS — H20011 Primary iridocyclitis, right eye: Secondary | ICD-10-CM | POA: Diagnosis not present

## 2015-01-16 DIAGNOSIS — H16232 Neurotrophic keratoconjunctivitis, left eye: Secondary | ICD-10-CM | POA: Diagnosis not present

## 2015-01-20 ENCOUNTER — Encounter (HOSPITAL_COMMUNITY): Payer: Medicare Other

## 2015-01-20 ENCOUNTER — Ambulatory Visit (HOSPITAL_COMMUNITY): Payer: Medicare Other

## 2015-01-23 DIAGNOSIS — H1712 Central corneal opacity, left eye: Secondary | ICD-10-CM | POA: Diagnosis not present

## 2015-01-23 DIAGNOSIS — H1859 Other hereditary corneal dystrophies: Secondary | ICD-10-CM | POA: Diagnosis not present

## 2015-01-23 DIAGNOSIS — H20012 Primary iridocyclitis, left eye: Secondary | ICD-10-CM | POA: Diagnosis not present

## 2015-02-20 DIAGNOSIS — H1712 Central corneal opacity, left eye: Secondary | ICD-10-CM | POA: Diagnosis not present

## 2015-02-20 DIAGNOSIS — Z961 Presence of intraocular lens: Secondary | ICD-10-CM | POA: Diagnosis not present

## 2015-02-20 DIAGNOSIS — H209 Unspecified iridocyclitis: Secondary | ICD-10-CM | POA: Diagnosis not present

## 2015-02-20 DIAGNOSIS — H1859 Other hereditary corneal dystrophies: Secondary | ICD-10-CM | POA: Diagnosis not present

## 2015-02-23 ENCOUNTER — Other Ambulatory Visit: Payer: Self-pay | Admitting: Urology

## 2015-02-23 DIAGNOSIS — C61 Malignant neoplasm of prostate: Secondary | ICD-10-CM

## 2015-03-06 ENCOUNTER — Encounter (HOSPITAL_COMMUNITY): Admission: RE | Admit: 2015-03-06 | Payer: Medicare Other | Source: Ambulatory Visit

## 2015-03-06 ENCOUNTER — Encounter (HOSPITAL_COMMUNITY)
Admission: RE | Admit: 2015-03-06 | Discharge: 2015-03-06 | Disposition: A | Discharge status: Home / Self Care | Payer: Medicare Other | Source: Ambulatory Visit | Attending: Urology | Admitting: Urology

## 2015-03-06 DIAGNOSIS — Z8546 Personal history of malignant neoplasm of prostate: Secondary | ICD-10-CM | POA: Diagnosis not present

## 2015-03-06 DIAGNOSIS — C61 Malignant neoplasm of prostate: Secondary | ICD-10-CM | POA: Diagnosis not present

## 2015-03-06 MED ORDER — TECHNETIUM TC 99M MEDRONATE IV KIT
25.0000 | PACK | Freq: Once | INTRAVENOUS | Status: AC | PRN
Start: 2015-03-06 — End: 2015-03-06
  Administered 2015-03-06: 25 via INTRAVENOUS

## 2015-03-28 DIAGNOSIS — E785 Hyperlipidemia, unspecified: Secondary | ICD-10-CM | POA: Diagnosis not present

## 2015-03-28 DIAGNOSIS — E119 Type 2 diabetes mellitus without complications: Secondary | ICD-10-CM | POA: Diagnosis not present

## 2015-03-28 DIAGNOSIS — I10 Essential (primary) hypertension: Secondary | ICD-10-CM | POA: Diagnosis not present

## 2015-03-30 DIAGNOSIS — E785 Hyperlipidemia, unspecified: Secondary | ICD-10-CM | POA: Diagnosis not present

## 2015-03-30 DIAGNOSIS — I10 Essential (primary) hypertension: Secondary | ICD-10-CM | POA: Diagnosis not present

## 2015-03-30 DIAGNOSIS — E119 Type 2 diabetes mellitus without complications: Secondary | ICD-10-CM | POA: Diagnosis not present

## 2015-04-25 DIAGNOSIS — H44522 Atrophy of globe, left eye: Secondary | ICD-10-CM | POA: Diagnosis not present

## 2015-04-25 DIAGNOSIS — H2012 Chronic iridocyclitis, left eye: Secondary | ICD-10-CM | POA: Diagnosis not present

## 2015-04-25 DIAGNOSIS — H4050X3 Glaucoma secondary to other eye disorders, unspecified eye, severe stage: Secondary | ICD-10-CM | POA: Diagnosis not present

## 2015-05-03 DIAGNOSIS — C61 Malignant neoplasm of prostate: Secondary | ICD-10-CM | POA: Diagnosis not present

## 2015-05-03 DIAGNOSIS — E039 Hypothyroidism, unspecified: Secondary | ICD-10-CM | POA: Diagnosis not present

## 2015-05-09 DIAGNOSIS — E118 Type 2 diabetes mellitus with unspecified complications: Secondary | ICD-10-CM | POA: Diagnosis not present

## 2015-05-09 DIAGNOSIS — E0789 Other specified disorders of thyroid: Secondary | ICD-10-CM | POA: Diagnosis not present

## 2015-05-10 DIAGNOSIS — R911 Solitary pulmonary nodule: Secondary | ICD-10-CM | POA: Diagnosis not present

## 2015-05-10 DIAGNOSIS — C61 Malignant neoplasm of prostate: Secondary | ICD-10-CM | POA: Diagnosis not present

## 2015-05-10 DIAGNOSIS — R350 Frequency of micturition: Secondary | ICD-10-CM | POA: Diagnosis not present

## 2015-05-10 DIAGNOSIS — N281 Cyst of kidney, acquired: Secondary | ICD-10-CM | POA: Diagnosis not present

## 2015-05-26 DIAGNOSIS — R922 Inconclusive mammogram: Secondary | ICD-10-CM | POA: Diagnosis not present

## 2015-05-26 DIAGNOSIS — Z8546 Personal history of malignant neoplasm of prostate: Secondary | ICD-10-CM | POA: Diagnosis not present

## 2015-05-26 DIAGNOSIS — Z923 Personal history of irradiation: Secondary | ICD-10-CM | POA: Diagnosis not present

## 2015-05-26 DIAGNOSIS — C7801 Secondary malignant neoplasm of right lung: Secondary | ICD-10-CM | POA: Diagnosis not present

## 2015-05-26 DIAGNOSIS — C61 Malignant neoplasm of prostate: Secondary | ICD-10-CM | POA: Diagnosis not present

## 2015-05-26 DIAGNOSIS — C7802 Secondary malignant neoplasm of left lung: Secondary | ICD-10-CM | POA: Diagnosis not present

## 2015-05-29 ENCOUNTER — Ambulatory Visit (INDEPENDENT_AMBULATORY_CARE_PROVIDER_SITE_OTHER): Payer: Medicare Other | Admitting: Internal Medicine

## 2015-05-29 ENCOUNTER — Encounter: Payer: Self-pay | Admitting: Internal Medicine

## 2015-05-29 VITALS — BP 148/77 | HR 70 | Ht 71.0 in | Wt 192.0 lb

## 2015-05-29 DIAGNOSIS — I10 Essential (primary) hypertension: Secondary | ICD-10-CM | POA: Diagnosis not present

## 2015-05-29 DIAGNOSIS — R918 Other nonspecific abnormal finding of lung field: Secondary | ICD-10-CM

## 2015-05-29 NOTE — Patient Instructions (Signed)
Please see patient coordinator before you leave today  to schedule PET scan and I will you with results

## 2015-05-29 NOTE — Progress Notes (Signed)
Subjective:    Patient ID: Geoffrey Bennett, male    DOB: 03/02/1922   MRN: [id number]   Brief patient profile:  59  yowm never a regular smoker referred  To pulmonary clinic by Dr Shelia Media  07/27/2014 for eval of mpn on CT from 06/17/14 .    History of Present Illness  07/27/2014 1st [facility] office visit/ Anay Walter  Chief Complaint  Patient presents with  . Pulmonary Consult    Referred per Dr. Anda Kraft for eval of pulmonary nodule. Pt states that he was seen at [facility] 06/17/14 for cough and cxr was done at that time. Cough started in early June 2015 and has already resolved.   his baseline cxr is near nl and really shows nothing at all nodular and the cxr that promted the ct showed min atx L base in setting of apparent uri which pt says is 100% resolved with no residual cough, sob.  rec No change rx - f/u 3 m     10/25/2014 f/u ov/Afshin Chrystal re: mpns now asymptomatic Chief Complaint  Patient presents with  . Follow-up    Pt doing well and denies any new co's today.   rec No change rx   05/29/2015 f/u ov/Skyy Mcknight re: MPNs/ prostate primary/ still asymptomatic Chief Complaint  Patient presents with  . Follow-up    pt doing well, no breathing complaints today.       Not limited by breathing from desired activities still doing the Y every day/ resistance / more limited by viz problems - no depth perception    No obvious day to day or daytime variabilty or assoc chronic cough or cp or chest tightness, subjective wheeze overt sinus or hb symptoms. No unusual exp hx or h/o childhood pna/ asthma or knowledge of premature birth.  Sleeping ok without nocturnal  or early am exacerbation  of respiratory  c/o's or need for noct saba. Also denies any obvious fluctuation of symptoms with weather or environmental changes or other aggravating or alleviating factors except as outlined above   Current Medications, Allergies, Complete Past Medical History, Past Surgical History, Family History, and Social  History were reviewed in [employer] record.  ROS  The following are not active complaints unless bolded sore throat, dysphagia, dental problems, itching, sneezing,  nasal congestion or excess/ purulent secretions, ear ache,   fever, chills, sweats, unintended wt loss, pleuritic or exertional cp, hemoptysis,  orthopnea pnd or leg swelling, presyncope, palpitations, heartburn, abdominal pain, anorexia, nausea, vomiting, diarrhea  or change in bowel or urinary habits, change in stools or urine, dysuria,hematuria,  rash, arthralgias, visual complaints, headache, numbness weakness or ataxia or problems with walking or coordination,  change in mood/affect or memory.                  Objective:   Physical Exam  slt hoarse amb  elderly  wm nad much less robust  10/25/2014        189 > 05/29/2015  Wt Readings from Last 3 Encounters:  07/27/14 186 lb (84.369 kg)  07/13/14 191 lb (86.637 kg)  06/24/14 191 lb (86.637 kg)      HEENT: nl dentition, turbinates, and orophanx. Nl external ear canals without cough reflex   NECK :  without JVD/Nodes/TM/ nl carotid upstrokes bilaterally   LUNGS: no acc muscle use, clear to A and P bilaterally without cough on insp or exp maneuvers   CV:  RRR  no s3 or murmur or  increase in P2, trace to 1+ pitting bilateral lower ext edema   ABD:  soft and nontender with nl excursion in the supine position. No bruits or organomegaly, bowel sounds nl  MS:  warm without deformities, calf tenderness, cyanosis or clubbing  SKIN: warm and dry without lesions    NEURO:  alert, approp, no deficits          I personally reviewed images and agree with radiology impression as follows:  CXR:  06/02/15  Multiple bilateral pulmonary nodules are hypermetabolic, consistent with metastatic disease.  No evidence for hypermetabolic lymphadenopathy in the neck, chest, abdomen, or pelvis.  Mild asymmetric FDG accumulation noted in the right  prostate gland.          Assessment & Plan:

## 2015-06-02 ENCOUNTER — Ambulatory Visit (HOSPITAL_COMMUNITY)
Admission: RE | Admit: 2015-06-02 | Discharge: 2015-06-02 | Disposition: A | Discharge status: Home / Self Care | Payer: Medicare Other | Source: Ambulatory Visit | Attending: Internal Medicine | Admitting: Internal Medicine

## 2015-06-02 DIAGNOSIS — C61 Malignant neoplasm of prostate: Secondary | ICD-10-CM | POA: Diagnosis not present

## 2015-06-02 DIAGNOSIS — R918 Other nonspecific abnormal finding of lung field: Secondary | ICD-10-CM

## 2015-06-02 DIAGNOSIS — K8020 Calculus of gallbladder without cholecystitis without obstruction: Secondary | ICD-10-CM | POA: Insufficient documentation

## 2015-06-02 DIAGNOSIS — I7090 Unspecified atherosclerosis: Secondary | ICD-10-CM | POA: Diagnosis not present

## 2015-06-02 LAB — GLUCOSE, CAPILLARY: Glucose-Capillary: 130 mg/dL — ABNORMAL HIGH (ref 65–99)

## 2015-06-02 MED ORDER — FLUDEOXYGLUCOSE F - 18 (FDG) INJECTION
9.3800 | Freq: Once | INTRAVENOUS | Status: AC | PRN
  Administered 2015-06-02: 9.38 via INTRAVENOUS

## 2015-06-02 NOTE — Progress Notes (Signed)
Quick Note:  PET results faxed to Drs Shelia Media and [place] ______

## 2015-06-05 ENCOUNTER — Encounter: Payer: Self-pay | Admitting: Internal Medicine

## 2015-06-05 NOTE — Assessment & Plan Note (Signed)
Marginal control with slt swelling both legs/ reviewed meds/ diet > defer to Dr Shelia Media

## 2015-06-05 NOTE — Assessment & Plan Note (Signed)
07/21/14 CT no contrast Multiple diffuse right-sided pulmonary nodules. Differential  considerations include metastasis, intrathoracic pulmonary  metastatic disease, or less likely sequela of granulomatous disease  or other benign infection or inflammation - plain cxr 10/25/14 > def growth in at least 2 nodules on R   Still asymptomatic  I had an extended discussion with the patient (after collaborating with Dr Diona Fanti) reviewing all relevant studies completed to date and  lasting 15 to 20 minutes of a 25 minute visit on the following ongoing concerns:   1) not clear this is prostate ca but it's almost certainly met ca in some form   2) Dr D considering trial of hormonal therapy and watching how the nodules do over the next 3-6 m which is reasonable  3) pt declines bx for tissues dx at this point and even declining hormonal therapy having read the possible side effects but I've asked him to reconsider  4) if symptoms of cough/ hemoptyis/ pleuritic cp develop decision will need to be made how best to palliate s tissue dx and pt's feeling is "at age 81, we'll cross  that bridge when we come to it"   5) pulmonary f/u in this setting can therefore be prn

## 2015-06-06 ENCOUNTER — Telehealth: Payer: Self-pay | Admitting: Internal Medicine

## 2015-06-06 NOTE — Telephone Encounter (Signed)
Received e-mail through the [facility] healthlink-   Daughter states- I'm Geoffrey Bennett daughter. He said he filled out the form to indicate that I am authorized to speak with you about his medical history. In discussing the conversation you had with him on Friday regarding the PET scan, he realized he failed to ask you a very basic question...did the PET scan indicate that the spots on his lungs were cancer and also does there appear to be cancer in the prostate area?  I really appreciate your kindness to my dad and you care for my mom as well. [name]

## 2015-06-07 NOTE — Telephone Encounter (Signed)
Discussed limited options with daughter - father does not want to pursue any dx or rx at this point

## 2015-06-19 ENCOUNTER — Other Ambulatory Visit: Payer: Self-pay

## 2015-09-15 DIAGNOSIS — I10 Essential (primary) hypertension: Secondary | ICD-10-CM | POA: Diagnosis not present

## 2015-09-15 DIAGNOSIS — E785 Hyperlipidemia, unspecified: Secondary | ICD-10-CM | POA: Diagnosis not present

## 2015-09-15 DIAGNOSIS — Z0000 Encounter for general adult medical examination without abnormal findings: Secondary | ICD-10-CM | POA: Diagnosis not present

## 2015-09-15 DIAGNOSIS — E119 Type 2 diabetes mellitus without complications: Secondary | ICD-10-CM | POA: Diagnosis not present

## 2015-09-21 ENCOUNTER — Encounter: Payer: Self-pay | Admitting: Cardiovascular Disease

## 2015-09-21 ENCOUNTER — Ambulatory Visit (INDEPENDENT_AMBULATORY_CARE_PROVIDER_SITE_OTHER): Payer: Medicare Other | Admitting: Cardiovascular Disease

## 2015-09-21 VITALS — BP 132/70 | HR 75 | Ht 72.0 in | Wt 181.0 lb

## 2015-09-21 DIAGNOSIS — I10 Essential (primary) hypertension: Secondary | ICD-10-CM

## 2015-09-21 DIAGNOSIS — I351 Nonrheumatic aortic (valve) insufficiency: Secondary | ICD-10-CM

## 2015-09-21 DIAGNOSIS — I453 Trifascicular block: Secondary | ICD-10-CM | POA: Insufficient documentation

## 2015-09-21 NOTE — Patient Instructions (Signed)
Dr. Croitoru recommends that you schedule a follow-up appointment in: ONE YEAR   

## 2015-09-21 NOTE — Progress Notes (Signed)
Patient ID: Geoffrey Bennett, male   DOB: 1922/12/06, 81 y.o.   MRN: [id number]      Cardiology Office Note   Date:  09/21/2015   ID:  Geoffrey Bennett, DOB 11/18/1922, MRN [id number]  PCP:  Geoffrey Pel, MD  Cardiologist:   Geoffrey Klein, MD   Chief Complaint  Patient presents with  . Annual Exam  . Edema    comes and goes in his feet      History of Present Illness: Geoffrey Bennett is a 81 y.o. male who presents for  Follow-up of cardiac arrhythmia and aortic insufficiency.   He has a long-standing history of frequent premature atrial contractions but also has had periods of asymptomatic second degree atrioventricular block, Mobitz type I (especially during sleep). He has  Substantial evidence of intraventricular conduction disease with a long PR interval, incomplete right bundle branch block and left anterior fascicular block. He has mild aortic insufficiency by echocardiography and a very distinct murmur. In addition he has treated hypertension, hyperlipidemia and type 2 diabetes mellitus.  Geoffrey Bennett continues to be very active. He goes to the gym ([employer]) 5 days a week exercises on the stationary bicycle for 15 minutes at a level of "10" and then uses the weight equipment for another 45 minutes. There has been no reduction in his exercise regimen.   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 He had a corneal injury and infection, after having had previous retinal detachments in that same eye.  Following these events, he has had some difficulty with depth perception and has had a couple of falls.After one of his falls he required 12 stitches in his forehead.    Past Medical History  Diagnosis Date  . Cataract   . Diabetes mellitus without complication   . Glaucoma   . Hypertension   . Retinal detachment     of left eye with laser surgery  . prostate ca dx'd 2008    seed implant    Past Surgical History  Procedure Laterality Date  . Eye surgery      . Hernia repair    . Joint replacement    . Prostate surgery       Current Outpatient Prescriptions  Medication Sig Dispense Refill  . ALPHAGAN P 0.1 % SOLN Apply 2 drops to eye 2 (two) times daily.    Marland Kitchen aspirin 81 MG tablet Take 81 mg by mouth daily.    Marland Kitchen atorvastatin (LIPITOR) 20 MG tablet Take 20 mg by mouth daily.    . cholecalciferol (VITAMIN D) 1000 UNITS tablet Take 1,000 Units by mouth daily.    . finasteride (PROSCAR) 5 MG tablet Take 5 mg by mouth daily.    . furosemide (LASIX) 20 MG tablet Take 20 mg by mouth daily.    Marland Kitchen losartan (COZAAR) 50 MG tablet Take 50 mg by mouth daily.    . niacin 500 MG tablet Take 500 mg by mouth at bedtime.    . Omega-3 Fatty Acids (FISH OIL) 1000 MG CAPS Take 2 capsules by mouth daily.    . prednisoLONE acetate (PRED FORTE) 1 % ophthalmic suspension Place 1 drop into both eyes as directed.    . sitaGLIPtin-metformin (JANUMET) 50-500 MG per tablet Take 1 tablet by mouth daily.    . timolol (TIMOPTIC) 0.5 % ophthalmic solution Apply 1 drop to eye 2 (two) times daily.     No current facility-administered medications for this visit.    Allergies:  Review of patient's allergies indicates no known allergies.    Social History:  The patient  reports that he has never smoked. He has never used smokeless tobacco. He reports that he does not drink alcohol or use illicit drugs.   Family History:  The patient's family history includes Cancer in his father; Heart disease in his mother.    ROS:  Please see the history of present illness.    Otherwise, review of systems positive for none.   All other systems are reviewed and negative.    PHYSICAL EXAM: VS:  BP 132/70 mmHg  Pulse 75  Ht 6' (1.829 m)  Wt 181 lb (82.101 kg)  BMI 24.54 kg/m2 , BMI Body mass index is 24.54 kg/(m^2).  General: Alert, oriented x3, no distress Head: EOMI, no exophtalmos or lid lag, no myxedema, no xanthelasma; normal ears, nose and oropharynx Neck: normal jugular  venous pulsations and no hepatojugular reflux; brisk carotid pulses without delay and no carotid bruits Chest: clear to auscultation, no signs of consolidation by percussion or palpation, normal fremitus, symmetrical and full respiratory excursions Cardiovascular: normal position and quality of the apical impulse, regular rhythm, normal first and second heart sounds,  2/6 decrescendo diastolic murmur at RUSB, no rub or gallops Abdomen: no tenderness or distention, no masses by palpation, no abnormal pulsatility or arterial bruits, normal bowel sounds, no hepatosplenomegaly Extremities: no clubbing, cyanosis or edema; 2+ radial, ulnar and brachial pulses bilaterally; 2+ right femoral, posterior tibial and dorsalis pedis pulses; 2+ left femoral, posterior tibial and dorsalis pedis pulses; no subclavian or femoral bruits Neurological: grossly nonfocal Psych: euthymic mood, full affect   EKG:  EKG is ordered today. The ekg ordered today demonstrates  Sinus rhythm with a single premature atrial contraction, long PR interval, incomplete right bundle branch block and left anterior fascicular block   Recent Labs: Followed by Dr. Drue Bennett Readings from Last 3 Encounters:  09/21/15 181 lb (82.101 kg)  05/29/15 192 lb (87.091 kg)  10/25/14 188 lb 12.8 oz (85.639 kg)      ASSESSMENT AND PLAN:  1.  Aortic insufficiency, not hemodynamically important, asymptomatic 2.  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 He has no symptoms of bradycardia and does not require pacemaker therapy at this time. Avoid negative chronotropic agents 3.  Hypertension, well controlled 4.  Type 2 diabetes mellitus and hyperlipidemia on statin therapy ; will retrieve his most recent labs from Dr. Shelia Bennett    Current medicines are reviewed at length with the patient today.  The patient does not have concerns regarding medicines.  The following changes have  been made:  no change  Labs/ tests ordered today include:  Orders Placed This Encounter  Procedures  . EKG 12-Lead    Patient Instructions  Dr. Sallyanne Bennett recommends that you schedule a follow-up appointment in: ONE YEAR       SignedSanda Klein, MD  09/21/2015 1:08 PM    Geoffrey Klein, MD, [facility] [phone] office [phone] pager

## 2015-09-22 DIAGNOSIS — I10 Essential (primary) hypertension: Secondary | ICD-10-CM | POA: Diagnosis not present

## 2015-09-22 DIAGNOSIS — C7800 Secondary malignant neoplasm of unspecified lung: Secondary | ICD-10-CM | POA: Diagnosis not present

## 2015-09-22 DIAGNOSIS — E1142 Type 2 diabetes mellitus with diabetic polyneuropathy: Secondary | ICD-10-CM | POA: Diagnosis not present

## 2015-09-22 DIAGNOSIS — C61 Malignant neoplasm of prostate: Secondary | ICD-10-CM | POA: Diagnosis not present

## 2015-09-25 ENCOUNTER — Encounter: Payer: Self-pay | Admitting: Cardiovascular Disease

## 2015-10-05 DIAGNOSIS — E785 Hyperlipidemia, unspecified: Secondary | ICD-10-CM | POA: Diagnosis not present

## 2015-10-05 DIAGNOSIS — E119 Type 2 diabetes mellitus without complications: Secondary | ICD-10-CM | POA: Diagnosis not present

## 2015-10-05 DIAGNOSIS — Z23 Encounter for immunization: Secondary | ICD-10-CM | POA: Diagnosis not present

## 2015-10-05 DIAGNOSIS — I10 Essential (primary) hypertension: Secondary | ICD-10-CM | POA: Diagnosis not present

## 2015-10-30 DIAGNOSIS — H44522 Atrophy of globe, left eye: Secondary | ICD-10-CM | POA: Diagnosis not present

## 2015-10-30 DIAGNOSIS — H4052X2 Glaucoma secondary to other eye disorders, left eye, moderate stage: Secondary | ICD-10-CM | POA: Diagnosis not present

## 2015-11-09 DIAGNOSIS — E049 Nontoxic goiter, unspecified: Secondary | ICD-10-CM | POA: Diagnosis not present

## 2015-11-09 DIAGNOSIS — E039 Hypothyroidism, unspecified: Secondary | ICD-10-CM | POA: Diagnosis not present

## 2016-01-15 DIAGNOSIS — Z0000 Encounter for general adult medical examination without abnormal findings: Secondary | ICD-10-CM | POA: Diagnosis not present

## 2016-01-15 DIAGNOSIS — C61 Malignant neoplasm of prostate: Secondary | ICD-10-CM | POA: Diagnosis not present

## 2016-01-15 DIAGNOSIS — R911 Solitary pulmonary nodule: Secondary | ICD-10-CM | POA: Diagnosis not present

## 2016-01-19 DIAGNOSIS — R918 Other nonspecific abnormal finding of lung field: Secondary | ICD-10-CM | POA: Diagnosis not present

## 2016-01-19 DIAGNOSIS — Z8546 Personal history of malignant neoplasm of prostate: Secondary | ICD-10-CM | POA: Diagnosis not present

## 2016-01-19 DIAGNOSIS — C61 Malignant neoplasm of prostate: Secondary | ICD-10-CM | POA: Diagnosis not present

## 2016-02-27 DIAGNOSIS — H4052X2 Glaucoma secondary to other eye disorders, left eye, moderate stage: Secondary | ICD-10-CM | POA: Diagnosis not present

## 2016-02-27 DIAGNOSIS — H44522 Atrophy of globe, left eye: Secondary | ICD-10-CM | POA: Diagnosis not present

## 2016-02-27 DIAGNOSIS — H2012 Chronic iridocyclitis, left eye: Secondary | ICD-10-CM | POA: Diagnosis not present

## 2016-02-27 DIAGNOSIS — H4061X1 Glaucoma secondary to drugs, right eye, mild stage: Secondary | ICD-10-CM | POA: Diagnosis not present

## 2016-04-02 DIAGNOSIS — E119 Type 2 diabetes mellitus without complications: Secondary | ICD-10-CM | POA: Diagnosis not present

## 2016-04-02 DIAGNOSIS — I10 Essential (primary) hypertension: Secondary | ICD-10-CM | POA: Diagnosis not present

## 2016-04-02 DIAGNOSIS — E1142 Type 2 diabetes mellitus with diabetic polyneuropathy: Secondary | ICD-10-CM | POA: Diagnosis not present

## 2016-04-02 DIAGNOSIS — E785 Hyperlipidemia, unspecified: Secondary | ICD-10-CM | POA: Diagnosis not present

## 2016-04-04 DIAGNOSIS — I10 Essential (primary) hypertension: Secondary | ICD-10-CM | POA: Diagnosis not present

## 2016-04-04 DIAGNOSIS — E785 Hyperlipidemia, unspecified: Secondary | ICD-10-CM | POA: Diagnosis not present

## 2016-04-04 DIAGNOSIS — E119 Type 2 diabetes mellitus without complications: Secondary | ICD-10-CM | POA: Diagnosis not present

## 2016-05-01 DIAGNOSIS — E039 Hypothyroidism, unspecified: Secondary | ICD-10-CM | POA: Diagnosis not present

## 2016-05-08 DIAGNOSIS — E0789 Other specified disorders of thyroid: Secondary | ICD-10-CM | POA: Diagnosis not present

## 2016-05-08 DIAGNOSIS — E118 Type 2 diabetes mellitus with unspecified complications: Secondary | ICD-10-CM | POA: Diagnosis not present

## 2016-05-08 DIAGNOSIS — I10 Essential (primary) hypertension: Secondary | ICD-10-CM | POA: Diagnosis not present

## 2016-05-08 DIAGNOSIS — C61 Malignant neoplasm of prostate: Secondary | ICD-10-CM | POA: Diagnosis not present

## 2016-05-08 DIAGNOSIS — E789 Disorder of lipoprotein metabolism, unspecified: Secondary | ICD-10-CM | POA: Diagnosis not present

## 2016-05-13 DIAGNOSIS — C61 Malignant neoplasm of prostate: Secondary | ICD-10-CM | POA: Diagnosis not present

## 2016-05-13 DIAGNOSIS — N401 Enlarged prostate with lower urinary tract symptoms: Secondary | ICD-10-CM | POA: Diagnosis not present

## 2016-05-13 DIAGNOSIS — R911 Solitary pulmonary nodule: Secondary | ICD-10-CM | POA: Diagnosis not present

## 2016-05-13 DIAGNOSIS — R350 Frequency of micturition: Secondary | ICD-10-CM | POA: Diagnosis not present

## 2016-06-27 DIAGNOSIS — H2012 Chronic iridocyclitis, left eye: Secondary | ICD-10-CM | POA: Diagnosis not present

## 2016-06-27 DIAGNOSIS — H44522 Atrophy of globe, left eye: Secondary | ICD-10-CM | POA: Diagnosis not present

## 2016-06-27 DIAGNOSIS — H43811 Vitreous degeneration, right eye: Secondary | ICD-10-CM | POA: Diagnosis not present

## 2016-06-27 DIAGNOSIS — H4052X2 Glaucoma secondary to other eye disorders, left eye, moderate stage: Secondary | ICD-10-CM | POA: Diagnosis not present

## 2016-07-02 ENCOUNTER — Ambulatory Visit (INDEPENDENT_AMBULATORY_CARE_PROVIDER_SITE_OTHER): Payer: Medicare Other | Admitting: Urgent Care

## 2016-07-02 VITALS — BP 120/76 | HR 91 | Temp 97.6°F | Resp 18 | Ht 72.0 in | Wt 188.0 lb

## 2016-07-02 DIAGNOSIS — Z87442 Personal history of urinary calculi: Secondary | ICD-10-CM | POA: Diagnosis not present

## 2016-07-02 DIAGNOSIS — N3090 Cystitis, unspecified without hematuria: Secondary | ICD-10-CM | POA: Diagnosis not present

## 2016-07-02 DIAGNOSIS — Z8546 Personal history of malignant neoplasm of prostate: Secondary | ICD-10-CM | POA: Diagnosis not present

## 2016-07-02 DIAGNOSIS — R300 Dysuria: Secondary | ICD-10-CM | POA: Diagnosis not present

## 2016-07-02 LAB — POCT URINALYSIS DIP (MANUAL ENTRY)
BILIRUBIN UA: NEGATIVE
Glucose, UA: NEGATIVE
Ketones, POC UA: NEGATIVE
NITRITE UA: POSITIVE — AB
PH UA: 5.5
Spec Grav, UA: 1.02
UROBILINOGEN UA: 0.2

## 2016-07-02 LAB — POC MICROSCOPIC URINALYSIS (UMFC): MUCUS RE: ABSENT

## 2016-07-02 MED ORDER — CEPHALEXIN 500 MG PO CAPS: 500.0000 mg | ORAL_CAPSULE | Freq: Two times a day (BID) | ORAL | Status: DC

## 2016-07-02 NOTE — Patient Instructions (Addendum)
Urinary Tract Infection Urinary tract infections (UTIs) can develop anywhere along your urinary tract. Your urinary tract is your body's drainage system for removing wastes and extra water. Your urinary tract includes two kidneys, two ureters, a bladder, and a urethra. Your kidneys are a pair of bean-shaped organs. Each kidney is about the size of your fist. They are located below your ribs, one on each side of your spine. CAUSES Infections are caused by microbes, which are microscopic organisms, including fungi, viruses, and bacteria. These organisms are so small that they can only be seen through a microscope. Bacteria are the microbes that most commonly cause UTIs. SYMPTOMS  Symptoms of UTIs may vary by age and gender of the patient and by the location of the infection. Symptoms in young women typically include a frequent and intense urge to urinate and a painful, burning feeling in the bladder or urethra during urination. Older women and men are more likely to be tired, shaky, and weak and have muscle aches and abdominal pain. A fever may mean the infection is in your kidneys. Other symptoms of a kidney infection include pain in your back or sides below the ribs, nausea, and vomiting. DIAGNOSIS To diagnose a UTI, your caregiver will ask you about your symptoms. Your caregiver will also ask you to provide a urine sample. The urine sample will be tested for bacteria and white blood cells. White blood cells are made by your body to help fight infection. TREATMENT  Typically, UTIs can be treated with medication. Because most UTIs are caused by a bacterial infection, they usually can be treated with the use of antibiotics. The choice of antibiotic and length of treatment depend on your symptoms and the type of bacteria causing your infection. HOME CARE INSTRUCTIONS  If you were prescribed antibiotics, take them exactly as your caregiver instructs you. Finish the medication even if you feel better after  you have only taken some of the medication.  Drink enough water and fluids to keep your urine clear or pale yellow.  Avoid caffeine, tea, and carbonated beverages. They tend to irritate your bladder.  Empty your bladder often. Avoid holding urine for long periods of time.  Empty your bladder before and after sexual intercourse.  After a bowel movement, women should cleanse from front to back. Use each tissue only once. SEEK MEDICAL CARE IF:   You have back pain.  You develop a fever.  Your symptoms do not begin to resolve within 3 days. SEEK IMMEDIATE MEDICAL CARE IF:   You have severe back pain or lower abdominal pain.  You develop chills.  You have nausea or vomiting.  You have continued burning or discomfort with urination. MAKE SURE YOU:   Understand these instructions.  Will watch your condition.  Will get help right away if you are not doing well or get worse.   This information is not intended to replace advice given to you by your health care provider. Make sure you discuss any questions you have with your health care provider.   Document Released: 09/18/2005 Document Revised: 08/30/2015 Document Reviewed: 01/17/2012 Elsevier Interactive Patient Education 2016 [employer].     IF you received an x-ray today, you will receive an invoice from [facility]. Please contact [facility] at [phone] with questions or concerns regarding your invoice.   IF you received labwork today, you will receive an invoice from [employer]. Please contact [facility] at [phone] with questions or concerns regarding your invoice.  Our billing staff will not be able to assist you with questions regarding bills from these companies.  You will be contacted with the lab results as soon as they are available. The fastest way to get your results is to activate your My Chart account. Instructions are located on the last page of this  paperwork. If you have not heard from [place] regarding the results in 2 weeks, please contact this office.

## 2016-07-02 NOTE — Progress Notes (Signed)
    MRN: [id number] DOB: 06-22-1922  Subjective:   Geoffrey Bennett is a 82 y.o. male presenting for chief complaint of Dysuria  Reports 2 week history of dysuria, urinary frequency and urinary urgency. Has not tried any medications for relief. Admits history of prostate cancer. Since, patient has had issues with urinary tract infections, kidney stones. Denies fever, hematuria, flank pain, abdominal pain, pelvic pain, cloudy malordorous urine, genital rash and genital irritation, nausea and vomiting.   Geoffrey Bennett has a current medication list which includes the following prescription(s): alphagan p, aspirin, atorvastatin, cholecalciferol, finasteride, furosemide, losartan, sitagliptin-metformin, and timolol. Patient has No Known Allergies.  Geoffrey Bennett  has a past medical history of Cataract; Diabetes mellitus without complication ([place]); Glaucoma; Hypertension; Retinal detachment; and prostate ca (dx'd 2008). Also  has past surgical history that includes Eye surgery; Hernia repair; Joint replacement; and Prostate surgery.  Objective:   Vitals: BP 120/76 mmHg  Pulse 91  Temp(Src) 97.6 F (36.4 C) (Oral)  Resp 18  Ht 6' (1.829 m)  Wt 188 lb (85.276 kg)  BMI 25.49 kg/m2  SpO2 94%  Physical Exam  Constitutional: He is oriented to person, place, and time. He appears well-developed and well-nourished.  HENT:  Mouth/Throat: Oropharynx is clear and moist.  Cardiovascular: Normal rate, regular rhythm and intact distal pulses.  Exam reveals no gallop and no friction rub.   No murmur heard. Pulmonary/Chest: No respiratory distress. He has no wheezes. He has no rales.  Abdominal: Soft. Bowel sounds are normal. He exhibits no distension and no mass. There is no tenderness.  No CVA tenderness.  Neurological: He is alert and oriented to person, place, and time.  Skin: Skin is warm and dry.   Results for orders placed or performed in visit on 07/02/16 (from the past 24 hour(s))  POCT urinalysis dipstick      Status: Abnormal   Collection Time: 07/02/16  9:06 AM  Result Value Ref Range   Color, UA yellow yellow   Clarity, UA clear clear   Glucose, UA negative negative   Bilirubin, UA negative negative   Ketones, POC UA negative negative   Spec Grav, UA 1.020    Blood, UA moderate (A) negative   pH, UA 5.5    Protein Ur, POC =30 (A) negative   Urobilinogen, UA 0.2    Nitrite, UA Positive (A) Negative   Leukocytes, UA large (3+) (A) Negative  POCT Microscopic Urinalysis ([facility])     Status: Abnormal   Collection Time: 07/02/16  9:06 AM  Result Value Ref Range   WBC,UR,HPF,POC Moderate (A) None WBC/hpf   RBC,UR,HPF,POC Few (A) None RBC/hpf   Bacteria None None, Too numerous to count   Mucus Absent Absent   Epithelial Cells, UR Per Microscopy Few (A) None, Too numerous to count cells/hpf   Assessment and Plan :   1. Cystitis 2. Dysuria 3. History of prostate cancer 4. History of renal stone - Start Keflex, urine culture pending. Advised adequate hydration, use probiotic with antibiotic. RTC in 1 week if no improvement.  Jaynee Eagles, PA-C [facility] [phone] 07/02/2016 8:53 AM

## 2016-07-04 LAB — URINE CULTURE

## 2016-09-11 DIAGNOSIS — C61 Malignant neoplasm of prostate: Secondary | ICD-10-CM | POA: Diagnosis not present

## 2016-09-17 DIAGNOSIS — Z0000 Encounter for general adult medical examination without abnormal findings: Secondary | ICD-10-CM | POA: Diagnosis not present

## 2016-09-17 DIAGNOSIS — E785 Hyperlipidemia, unspecified: Secondary | ICD-10-CM | POA: Diagnosis not present

## 2016-09-17 DIAGNOSIS — I10 Essential (primary) hypertension: Secondary | ICD-10-CM | POA: Diagnosis not present

## 2016-09-17 DIAGNOSIS — Z23 Encounter for immunization: Secondary | ICD-10-CM | POA: Diagnosis not present

## 2016-09-17 DIAGNOSIS — E119 Type 2 diabetes mellitus without complications: Secondary | ICD-10-CM | POA: Diagnosis not present

## 2016-09-18 ENCOUNTER — Other Ambulatory Visit: Payer: Self-pay | Admitting: Urology

## 2016-09-18 DIAGNOSIS — N400 Enlarged prostate without lower urinary tract symptoms: Secondary | ICD-10-CM | POA: Diagnosis not present

## 2016-09-18 DIAGNOSIS — E875 Hyperkalemia: Secondary | ICD-10-CM | POA: Diagnosis not present

## 2016-09-18 DIAGNOSIS — C61 Malignant neoplasm of prostate: Secondary | ICD-10-CM | POA: Diagnosis not present

## 2016-09-18 DIAGNOSIS — R9721 Rising PSA following treatment for malignant neoplasm of prostate: Secondary | ICD-10-CM | POA: Diagnosis not present

## 2016-09-26 ENCOUNTER — Ambulatory Visit (HOSPITAL_COMMUNITY)
Admission: RE | Admit: 2016-09-26 | Discharge: 2016-09-26 | Disposition: A | Discharge status: Home / Self Care | Payer: Medicare Other | Source: Ambulatory Visit | Attending: Urology | Admitting: Urology

## 2016-09-26 ENCOUNTER — Encounter (HOSPITAL_COMMUNITY)
Admission: RE | Admit: 2016-09-26 | Discharge: 2016-09-26 | Disposition: A | Discharge status: Home / Self Care | Payer: Medicare Other | Source: Ambulatory Visit | Attending: Urology | Admitting: Urology

## 2016-09-26 DIAGNOSIS — E119 Type 2 diabetes mellitus without complications: Secondary | ICD-10-CM | POA: Diagnosis not present

## 2016-09-26 DIAGNOSIS — E785 Hyperlipidemia, unspecified: Secondary | ICD-10-CM | POA: Diagnosis not present

## 2016-09-26 DIAGNOSIS — I10 Essential (primary) hypertension: Secondary | ICD-10-CM | POA: Diagnosis not present

## 2016-09-26 DIAGNOSIS — C61 Malignant neoplasm of prostate: Secondary | ICD-10-CM | POA: Diagnosis not present

## 2016-09-30 DIAGNOSIS — L853 Xerosis cutis: Secondary | ICD-10-CM | POA: Diagnosis not present

## 2016-09-30 DIAGNOSIS — E1140 Type 2 diabetes mellitus with diabetic neuropathy, unspecified: Secondary | ICD-10-CM | POA: Diagnosis not present

## 2016-09-30 DIAGNOSIS — L820 Inflamed seborrheic keratosis: Secondary | ICD-10-CM | POA: Diagnosis not present

## 2016-09-30 DIAGNOSIS — C61 Malignant neoplasm of prostate: Secondary | ICD-10-CM | POA: Diagnosis not present

## 2016-09-30 DIAGNOSIS — Z8669 Personal history of other diseases of the nervous system and sense organs: Secondary | ICD-10-CM | POA: Diagnosis not present

## 2016-10-03 ENCOUNTER — Encounter: Payer: Self-pay | Admitting: Cardiovascular Disease

## 2016-10-03 ENCOUNTER — Ambulatory Visit (INDEPENDENT_AMBULATORY_CARE_PROVIDER_SITE_OTHER): Payer: Medicare Other | Admitting: Cardiovascular Disease

## 2016-10-03 VITALS — BP 138/56 | HR 59 | Ht 72.0 in | Wt 187.0 lb

## 2016-10-03 DIAGNOSIS — I10 Essential (primary) hypertension: Secondary | ICD-10-CM

## 2016-10-03 DIAGNOSIS — I351 Nonrheumatic aortic (valve) insufficiency: Secondary | ICD-10-CM

## 2016-10-03 DIAGNOSIS — E785 Hyperlipidemia, unspecified: Secondary | ICD-10-CM | POA: Diagnosis not present

## 2016-10-03 DIAGNOSIS — E1142 Type 2 diabetes mellitus with diabetic polyneuropathy: Secondary | ICD-10-CM | POA: Diagnosis not present

## 2016-10-03 DIAGNOSIS — I441 Atrioventricular block, second degree: Secondary | ICD-10-CM | POA: Diagnosis not present

## 2016-10-03 DIAGNOSIS — E119 Type 2 diabetes mellitus without complications: Secondary | ICD-10-CM

## 2016-10-03 DIAGNOSIS — E782 Mixed hyperlipidemia: Secondary | ICD-10-CM

## 2016-10-03 NOTE — Progress Notes (Signed)
Cardiology Office Note    Date:  10/04/2016   ID:  Geoffrey Bennett, DOB 1922-05-26, MRN [id number]  PCP:  Horatio Pel, MD  Cardiologist:   Sanda Klein, MD   Chief Complaint  Patient presents with  . Follow-up    1 year    History of Present Illness:  Geoffrey Bennett is a 82 y.o. male with a history of intracardiac conduction system disease, but without a history of syncope or severe bradycardia. He also has hypertension, hyperlipidemia, type 2 diabetes mellitus and mild aortic insufficiency  In spite of his advanced age continues to live independently and drives. He goes to the gym 5 days a week and continues to exercise following his usual regimen for about an hour on a combination of the stationary bicycle and weight machines.  He has not had dizziness, lightheadedness or falls since his last appointment. He denies any syncope. He has had occasional episodes of tripping due to visual difficulty and poor balance, but has not had any injuries. He sees Dr. Lorre Munroe at the [facility].      Past Medical History:  Diagnosis Date  . Cataract   . Diabetes mellitus without complication ([place])   . Glaucoma   . Hypertension   . prostate ca dx'd 2008   seed implant  . Retinal detachment    of left eye with laser surgery    Past Surgical History:  Procedure Laterality Date  . EYE SURGERY    . HERNIA REPAIR    . JOINT REPLACEMENT    . PROSTATE SURGERY      Current Medications: Outpatient Medications Prior to Visit  Medication Sig Dispense Refill  . ALPHAGAN P 0.1 % SOLN Apply 2 drops to eye 2 (two) times daily.    Marland Kitchen aspirin 81 MG tablet Take 81 mg by mouth daily.    Marland Kitchen atorvastatin (LIPITOR) 20 MG tablet Take 20 mg by mouth daily.    . cholecalciferol (VITAMIN D) 1000 UNITS tablet Take 1,000 Units by mouth daily.    . finasteride (PROSCAR) 5 MG tablet Take 5 mg by mouth daily.    . furosemide (LASIX) 20 MG tablet Take 20 mg by mouth daily.    Marland Kitchen losartan  (COZAAR) 50 MG tablet Take 50 mg by mouth daily.    . sitaGLIPtin-metformin (JANUMET) 50-500 MG per tablet Take 1 tablet by mouth daily.    . timolol (TIMOPTIC) 0.5 % ophthalmic solution Apply 1 drop to eye 2 (two) times daily.    . cephALEXin (KEFLEX) 500 MG capsule Take 1 capsule (500 mg total) by mouth 2 (two) times daily. 14 capsule 0   No facility-administered medications prior to visit.      Allergies:   Review of patient's allergies indicates no known allergies.   Social History   Social History  . Marital status: Widowed    Spouse name: N/A  . Number of children: N/A  . Years of education: N/A   Social History Main Topics  . Smoking status: Never Smoker  . Smokeless tobacco: Never Used  . Alcohol use No  . Drug use: No  . Sexual activity: No   Other Topics Concern  . None   Social History Narrative  . None     Family History:  The patient's family history includes Cancer in his father; Heart disease in his mother.   ROS:   Please see the history of present illness.    ROS All other systems reviewed  and are negative.   PHYSICAL EXAM:   VS:  BP (!) 138/56   Pulse (!) 59   Ht 6' (1.829 m)   Wt 187 lb (84.8 kg)   BMI 25.36 kg/m    GEN: Well nourished, well developed, in no acute distress  HEENT: normal  Neck: no JVD, carotid bruits, or masses Cardiac: Widely split second heart sound with an irregular rhythm; grade 1-2/6 aortic regurgitation decrescendo diastolic murmur, no rubs or gallops,no edema  Respiratory:  clear to auscultation bilaterally, normal work of breathing GI: soft, nontender, nondistended, + BS MS: no deformity or atrophy  Skin: warm and dry, no rash Neuro:  Alert and Oriented x 3, Strength and sensation are intact Psych: euthymic mood, full affect  Wt Readings from Last 3 Encounters:  10/03/16 187 lb (84.8 kg)  07/02/16 188 lb (85.3 kg)  09/21/15 181 lb (82.1 kg)      Studies/Labs Reviewed:   EKG:  EKG is ordered today.  The ekg  ordered today demonstrates Sinus rhythm with Mobitz type I second-degree AV block, incomplete right bundle branch block, left anterior fascicular block, QTC 396 ms  Recent Labs: No results found for requested labs within last 8760 hours.   Lipid Panel No results found for: CHOL, TRIG, HDL, CHOLHDL, VLDL, LDLCALC, LDLDIRECT  Additional studies/ records that were reviewed today include:  Is from [facility] glucose 136, hemoglobin A1c 6.7%, hemoglobin 12.6, creatinine 1.25, normal liver function tests. The lipid profile is not available.    ASSESSMENT:    1. Nonrheumatic aortic valve insufficiency   2. Second degree AV block, Mobitz type I   3. Essential hypertension   4. Type 2 diabetes mellitus without complication, without long-term current use of insulin ([place])   5. Mixed hyperlipidemia      PLAN:  In order of problems listed above:  1. AI: No symptoms of congestive heart failure, does not appear to be hemodynamically important 2. Second degree AV block: He continues to have evidence of slow progression of his intraventricular and AV conduction abnormalities, but unless he develops symptoms of syncope/presyncope, fatigue or dyspnea I don't think pacemaker implantation as yet necessary. He is asked to report the symptoms promptly. All negative chronotropic agents should be carefully avoided. 3. HTN: Well controlled 4. DM: Good glycemic control 5. HLP: Don't have his most recent lipid profile but he is taking a highly active statin. Target LDL<100.    Medication Adjustments/Labs and Tests Ordered: Current medicines are reviewed at length with the patient today.  Concerns regarding medicines are outlined above.  Medication changes, Labs and Tests ordered today are listed in the Patient Instructions below. Patient Instructions  Dr Sallyanne Kuster recommends that you schedule a follow-up appointment in 1 year. You will receive a reminder letter in the mail two months in advance. If you  don't receive a letter, please call our office to schedule the follow-up appointment.  If you need a refill on your cardiac medications before your next appointment, please call your pharmacy.    Signed, Sanda Klein, MD  10/04/2016 1:24 PM    [facility] [place], [place], [address]  [address] Phone: [phone]; Fax: [phone]

## 2016-10-03 NOTE — Patient Instructions (Signed)
Dr Croitoru recommends that you schedule a follow-up appointment in 1 year. You will receive a reminder letter in the mail two months in advance. If you don't receive a letter, please call our office to schedule the follow-up appointment.  If you need a refill on your cardiac medications before your next appointment, please call your pharmacy. 

## 2016-10-04 DIAGNOSIS — I441 Atrioventricular block, second degree: Secondary | ICD-10-CM | POA: Insufficient documentation

## 2016-10-21 ENCOUNTER — Encounter: Payer: Self-pay | Admitting: Internal Medicine

## 2016-11-20 ENCOUNTER — Ambulatory Visit (INDEPENDENT_AMBULATORY_CARE_PROVIDER_SITE_OTHER): Payer: Medicare Other | Admitting: Emergency Medicine

## 2016-11-20 VITALS — BP 134/80 | HR 75 | Temp 97.5°F | Resp 18 | Ht 72.0 in | Wt 190.0 lb

## 2016-11-20 DIAGNOSIS — R300 Dysuria: Secondary | ICD-10-CM | POA: Diagnosis not present

## 2016-11-20 LAB — POCT URINALYSIS DIP (MANUAL ENTRY)
Bilirubin, UA: NEGATIVE
Glucose, UA: NEGATIVE
Ketones, POC UA: NEGATIVE
LEUKOCYTES UA: NEGATIVE
NITRITE UA: NEGATIVE
PROTEIN UA: NEGATIVE
RBC UA: NEGATIVE
Spec Grav, UA: 1.02
UROBILINOGEN UA: 1
pH, UA: 5.5

## 2016-11-20 LAB — POC MICROSCOPIC URINALYSIS (UMFC): MUCUS RE: ABSENT

## 2016-11-20 NOTE — Patient Instructions (Addendum)
Please make appointment with Dr. Diona Fanti if you continue to have symptoms. I will call you with results of your culture.    IF you received an x-ray today, you will receive an invoice from [facility]. Please contact [facility] at [phone] with questions or concerns regarding your invoice.   IF you received labwork today, you will receive an invoice from [employer]. Please contact [facility] at [phone] with questions or concerns regarding your invoice.   Our billing staff will not be able to assist you with questions regarding bills from these companies.  You will be contacted with the lab results as soon as they are available. The fastest way to get your results is to activate your My Chart account. Instructions are located on the last page of this paperwork. If you have not heard from [place] regarding the results in 2 weeks, please contact this office.

## 2016-11-20 NOTE — Progress Notes (Signed)
By signing my name below, I, Moises Blood, attest that this documentation has been prepared under the direction and in the presence of Arlyss Queen, MD. Electronically Signed: Moises Blood, [place]. 11/20/2016 , 8:32 AM .  Patient was seen in room 3 .  Chief Complaint:  Chief Complaint  Patient presents with  . Dysuria    HPI: Geoffrey Bennett is a 82 y.o. male who reports to [facility] today complaining of dysuria ongoing for about 6 months now. He was previously seen by Jaynee Eagles, PA-C in July, and was given antibiotics. His symptoms improved at that time, but not resolved. He still has minimal dysuria but it's irritating. If he doesn't urinate, he would have urinary incontinence and has been wearing adult diapers. He doesn't feel like he's been emptying his bladder. He also takes water pills and causes him to urinate frequently, but not more than usual. He denies straining to urinate.   His urine culture grew staph species and negative coagulase, >100k colonies. Sensitive to all except resistance to tetracycline.   He has a history of prostate cancer, followed by Dr. Diona Fanti; last visit was 6 months ago. He was diagnosed about 20 years ago, with prostatectomy and seed implant. His PSA has been creeping up in the last 3~4 years. He was given hormones for this issue.   Past Medical History:  Diagnosis Date  . Cataract   . Diabetes mellitus without complication ([place])   . Glaucoma   . Hypertension   . prostate ca dx'd 2008   seed implant  . Retinal detachment    of left eye with laser surgery   Past Surgical History:  Procedure Laterality Date  . EYE SURGERY    . HERNIA REPAIR    . JOINT REPLACEMENT    . PROSTATE SURGERY     Social History   Social History  . Marital status: Widowed    Spouse name: N/A  . Number of children: N/A  . Years of education: N/A   Social History Main Topics  . Smoking status: Never Smoker  . Smokeless tobacco: Never Used  . Alcohol use No    . Drug use: No  . Sexual activity: No   Other Topics Concern  . None   Social History Narrative  . None   Family History  Problem Relation Age of Onset  . Heart disease Mother   . Cancer Father    No Known Allergies Prior to Admission medications   Medication Sig Start Date End Date Taking? Authorizing Provider  ALPHAGAN P 0.1 % SOLN Apply 2 drops to eye 2 (two) times daily. 07/13/13  Yes Historical Provider, MD  aspirin 81 MG tablet Take 81 mg by mouth daily.   Yes Historical Provider, MD  atorvastatin (LIPITOR) 20 MG tablet Take 20 mg by mouth daily.   Yes Historical Provider, MD  cholecalciferol (VITAMIN D) 1000 UNITS tablet Take 1,000 Units by mouth daily.   Yes Historical Provider, MD  Cranberry 1000 MG CAPS Take by mouth.   Yes Historical Provider, MD  finasteride (PROSCAR) 5 MG tablet Take 5 mg by mouth daily. 07/26/13  Yes Historical Provider, MD  furosemide (LASIX) 20 MG tablet Take 20 mg by mouth daily.   Yes Historical Provider, MD  losartan (COZAAR) 50 MG tablet Take 50 mg by mouth daily. 06/19/13  Yes Historical Provider, MD  sitaGLIPtin-metformin (JANUMET) 50-500 MG per tablet Take 1 tablet by mouth daily.   Yes Historical Provider, MD  timolol (TIMOPTIC) 0.5 % ophthalmic solution Apply 1 drop to eye 2 (two) times daily. 06/18/13  Yes Historical Provider, MD     ROS:  Constitutional: negative for fever, chills, night sweats, weight changes, or fatigue  HEENT: negative for vision changes, hearing loss, congestion, rhinorrhea, ST, epistaxis, or sinus pressure Cardiovascular: negative for chest pain or palpitations Respiratory: negative for hemoptysis, wheezing, shortness of breath, or cough Abdominal: negative for abdominal pain, nausea, vomiting, diarrhea, or constipation GU: positive for dysuria Dermatological: negative for rash Neurologic: negative for headache, dizziness, or syncope All other systems reviewed and are otherwise negative with the exception to those  above and in the HPI.  PHYSICAL EXAM: Vitals:   11/20/16 0803  BP: 134/80  Pulse: 75  Resp: 18  Temp: 97.5 F (36.4 C)   Body mass index is 25.77 kg/m.   General: Alert, no acute distress HEENT:  Normocephalic, atraumatic, oropharynx patent. Eye: Juliette Mangle [facility] Cardiovascular:  Regular rate and rhythm, no rubs murmurs or gallops.  No Carotid bruits, radial pulse intact. No pedal edema.  Respiratory: Clear to auscultation bilaterally.  No wheezes, rales, or rhonchi.  No cyanosis, no use of accessory musculature Abdominal: No organomegaly, positive bowel sounds. No masses.; No cva tenderness, abd non tender Musculoskeletal: Gait intact. No edema, tenderness Skin: No rashes. The patient is not circumcised but has no evidence of balanitis. Neurologic: Facial musculature symmetric. Psychiatric: Patient acts appropriately throughout our interaction.  Lymphatic: No cervical or submandibular lymphadenopathy Genitourinary/Anorectal: No acute findings   LABS: Results for orders placed or performed in visit on 11/20/16  POCT Microscopic Urinalysis ([facility])  Result Value Ref Range   WBC,UR,HPF,POC None None WBC/hpf   RBC,UR,HPF,POC None None RBC/hpf   Bacteria None None, Too numerous to count   Mucus Absent Absent   Epithelial Cells, UR Per Microscopy Few (A) None, Too numerous to count cells/hpf  POCT urinalysis dipstick  Result Value Ref Range   Color, UA yellow yellow   Clarity, UA clear clear   Glucose, UA negative negative   Bilirubin, UA negative negative   Ketones, POC UA negative negative   Spec Grav, UA 1.020    Blood, UA negative negative   pH, UA 5.5    Protein Ur, POC negative negative   Urobilinogen, UA 1.0    Nitrite, UA Negative Negative   Leukocytes, UA Negative Negative     EKG/XRAY:     ASSESSMENT/PLAN: No evidence of infection by urinalysis. Patient reassured. If he continues to have symptoms I would advise he see Dr. Diona Fanti. Urine culture was done. I  did not do a glucose there was no glucosuria present. I personally performed the services described in this documentation, which was scribed in my presence. The recorded information has been reviewed and is accurate.  Gross sideeffects, risk and benefits, and alternatives of medications d/w patient. Patient is aware that all medications have potential sideeffects and we are unable to predict every sideeffect or drug-drug interaction that may occur.  Arlyss Queen MD 11/20/2016 8:15 AM

## 2016-11-21 LAB — URINE CULTURE: ORGANISM ID, BACTERIA: NO GROWTH

## 2017-01-02 DIAGNOSIS — H2012 Chronic iridocyclitis, left eye: Secondary | ICD-10-CM | POA: Diagnosis not present

## 2017-01-02 DIAGNOSIS — H43811 Vitreous degeneration, right eye: Secondary | ICD-10-CM | POA: Diagnosis not present

## 2017-01-02 DIAGNOSIS — H44522 Atrophy of globe, left eye: Secondary | ICD-10-CM | POA: Diagnosis not present

## 2017-01-02 DIAGNOSIS — H4052X2 Glaucoma secondary to other eye disorders, left eye, moderate stage: Secondary | ICD-10-CM | POA: Diagnosis not present

## 2017-01-24 DIAGNOSIS — C61 Malignant neoplasm of prostate: Secondary | ICD-10-CM | POA: Diagnosis not present

## 2017-01-31 DIAGNOSIS — N401 Enlarged prostate with lower urinary tract symptoms: Secondary | ICD-10-CM | POA: Diagnosis not present

## 2017-01-31 DIAGNOSIS — R9721 Rising PSA following treatment for malignant neoplasm of prostate: Secondary | ICD-10-CM | POA: Diagnosis not present

## 2017-01-31 DIAGNOSIS — C7919 Secondary malignant neoplasm of other urinary organs: Secondary | ICD-10-CM | POA: Diagnosis not present

## 2017-01-31 DIAGNOSIS — C61 Malignant neoplasm of prostate: Secondary | ICD-10-CM | POA: Diagnosis not present

## 2017-03-26 ENCOUNTER — Ambulatory Visit (INDEPENDENT_AMBULATORY_CARE_PROVIDER_SITE_OTHER): Payer: Medicare Other | Admitting: Emergency Medicine

## 2017-03-26 VITALS — BP 120/70 | HR 74 | Temp 98.2°F | Resp 16 | Ht 68.0 in | Wt 187.8 lb

## 2017-03-26 DIAGNOSIS — R339 Retention of urine, unspecified: Secondary | ICD-10-CM | POA: Diagnosis not present

## 2017-03-26 DIAGNOSIS — Z8546 Personal history of malignant neoplasm of prostate: Secondary | ICD-10-CM | POA: Diagnosis not present

## 2017-03-26 DIAGNOSIS — R300 Dysuria: Secondary | ICD-10-CM | POA: Diagnosis not present

## 2017-03-26 DIAGNOSIS — B999 Unspecified infectious disease: Secondary | ICD-10-CM

## 2017-03-26 DIAGNOSIS — N3090 Cystitis, unspecified without hematuria: Secondary | ICD-10-CM

## 2017-03-26 LAB — POCT URINALYSIS DIP (MANUAL ENTRY)
Bilirubin, UA: NEGATIVE
Glucose, UA: NEGATIVE
Ketones, POC UA: NEGATIVE
Leukocytes, UA: NEGATIVE
NITRITE UA: NEGATIVE
PH UA: 5.5 (ref 5.0–8.0)
Protein Ur, POC: NEGATIVE
SPEC GRAV UA: 1.02 (ref 1.030–1.035)
UROBILINOGEN UA: 0.2 (ref ?–2.0)

## 2017-03-26 MED ORDER — CIPROFLOXACIN HCL 500 MG PO TABS: 500.0000 mg | ORAL_TABLET | Freq: Two times a day (BID) | ORAL | 0 refills | Status: AC

## 2017-03-26 NOTE — Progress Notes (Signed)
Geoffrey Bennett 83 y.o.   Chief Complaint  Patient presents with  . urinary retention    HISTORY OF PRESENT ILLNESS: This is a 83 y.o. male complaining of burning on urination x several days. Has h/o same that always improves with antibiotics. Denies any other significant symptoms.  HPI   Prior to Admission medications   Medication Sig Start Date End Date Taking? Authorizing Provider  ALPHAGAN P 0.1 % SOLN Apply 2 drops to eye 2 (two) times daily. 07/13/13  Yes Historical Provider, MD  aspirin 81 MG tablet Take 81 mg by mouth daily.   Yes Historical Provider, MD  atorvastatin (LIPITOR) 20 MG tablet Take 20 mg by mouth daily.   Yes Historical Provider, MD  Cranberry 1000 MG CAPS Take by mouth.   Yes Historical Provider, MD  finasteride (PROSCAR) 5 MG tablet Take 5 mg by mouth daily. 07/26/13  Yes Historical Provider, MD  furosemide (LASIX) 20 MG tablet Take 20 mg by mouth daily.   Yes Historical Provider, MD  losartan (COZAAR) 50 MG tablet Take 50 mg by mouth daily. 06/19/13  Yes Historical Provider, MD  Multiple Vitamin (MULTIVITAMIN) tablet Take 1 tablet by mouth daily.   Yes Historical Provider, MD  sitaGLIPtin-metformin (JANUMET) 50-500 MG per tablet Take 1 tablet by mouth daily.   Yes Historical Provider, MD  timolol (TIMOPTIC) 0.5 % ophthalmic solution Apply 1 drop to eye 2 (two) times daily. 06/18/13  Yes Historical Provider, MD  cholecalciferol (VITAMIN D) 1000 UNITS tablet Take 1,000 Units by mouth daily.    Historical Provider, MD    No Known Allergies  Patient Active Problem List   Diagnosis Date Noted  . Second degree AV block, Mobitz type I 10/04/2016  . Trifascicular block 09/21/2015  . Multiple pulmonary nodules 07/27/2014  . Aortic insufficiency 08/25/2013  . Arrhythmia 08/25/2013  . Arrhythmia, atrial 07/28/2013  . Hyperlipidemia 07/28/2013  . DM (diabetes mellitus) ([place]) 03/22/2013  . Essential hypertension 03/22/2013  . BPH (benign prostatic hyperplasia)  03/22/2013  . Hearing loss 03/22/2013    Past Medical History:  Diagnosis Date  . Cataract   . Diabetes mellitus without complication ([place])   . Glaucoma   . Hypertension   . prostate ca dx'd 2008   seed implant  . Retinal detachment    of left eye with laser surgery    Past Surgical History:  Procedure Laterality Date  . EYE SURGERY    . HERNIA REPAIR    . JOINT REPLACEMENT    . PROSTATE SURGERY      Social History   Social History  . Marital status: Widowed    Spouse name: N/A  . Number of children: N/A  . Years of education: N/A   Occupational History  . Not on file.   Social History Main Topics  . Smoking status: Never Smoker  . Smokeless tobacco: Never Used  . Alcohol use No  . Drug use: No  . Sexual activity: No   Other Topics Concern  . Not on file   Social History Narrative  . No narrative on file    Family History  Problem Relation Age of Onset  . Heart disease Mother   . Cancer Father      Review of Systems  Constitutional: Negative.  Negative for chills and fever.  HENT: Negative.   Eyes: Negative.   Respiratory: Negative.  Negative for shortness of breath.   Cardiovascular: Negative.  Negative for chest pain and palpitations.  Gastrointestinal:  Negative.  Negative for abdominal pain, diarrhea, nausea and vomiting.  Genitourinary: Positive for dysuria, frequency and urgency. Negative for flank pain and hematuria.  Skin: Negative.  Negative for rash.  Neurological: Negative for dizziness, focal weakness and headaches.  Endo/Heme/Allergies: Negative.   All other systems reviewed and are negative.   Vitals:   03/26/17 0814 03/26/17 0818  BP: (!) 158/67 120/70  Pulse: 74   Resp: 16   Temp: 98.2 F (36.8 C)    Results for orders placed or performed in visit on 03/26/17 (from the past 24 hour(s))  POCT urinalysis dipstick     Status: Abnormal   Collection Time: 03/26/17  8:47 AM  Result Value Ref Range   Color, UA yellow yellow    Clarity, UA clear clear   Glucose, UA negative negative   Bilirubin, UA negative negative   Ketones, POC UA negative negative   Spec Grav, UA 1.020 1.030 - 1.035   Blood, UA trace-intact (A) negative   pH, UA 5.5 5.0 - 8.0   Protein Ur, POC negative negative   Urobilinogen, UA 0.2 Negative - 2.0   Nitrite, UA Negative Negative   Leukocytes, UA Negative Negative    Physical Exam  Constitutional: He is oriented to person, place, and time. He appears well-developed and well-nourished.  HENT:  Head: Normocephalic and atraumatic.  Eyes: Pupils are equal, round, and reactive to light.  Neck: Normal range of motion. Neck supple. No JVD present.  Cardiovascular: Normal rate and regular rhythm.   Pulmonary/Chest: Effort normal and breath sounds normal.  Abdominal: Soft. Bowel sounds are normal. He exhibits no distension. There is no tenderness.  Musculoskeletal: Normal range of motion.  Neurological: He is alert and oriented to person, place, and time. No sensory deficit. He exhibits normal muscle tone.  Skin: Skin is warm. Capillary refill takes less than 2 seconds.  Psychiatric: He has a normal mood and affect. His behavior is normal.  Vitals reviewed.    ASSESSMENT & PLAN: Geoffrey Bennett was seen today for urinary retention.  Diagnoses and all orders for this visit:  Dysuria  Urine retention -     Urine culture -     POCT urinalysis dipstick  Cystitis  History of prostate cancer  Clinical infection  Other orders -     ciprofloxacin (CIPRO) 500 MG tablet; Take 1 tablet (500 mg total) by mouth 2 (two) times daily.    Patient Instructions       IF you received an x-ray today, you will receive an invoice from [facility]. Please contact [facility] at [phone] with questions or concerns regarding your invoice.   IF you received labwork today, you will receive an invoice from [facility] [facility]. Please contact [facility] at [phone] with questions or concerns  regarding your invoice.   Our billing staff will not be able to assist you with questions regarding bills from these companies.  You will be contacted with the lab results as soon as they are available. The fastest way to get your results is to activate your My Chart account. Instructions are located on the last page of this paperwork. If you have not heard from [place] regarding the results in 2 weeks, please contact this office.     Urinary Tract Infection, Adult A urinary tract infection (UTI) is an infection of any part of the urinary tract. The urinary tract includes the:  Kidneys.  Ureters.  Bladder.  Urethra. These organs make, store, and get rid of pee (urine) in  the body. Follow these instructions at home:  Take over-the-counter and prescription medicines only as told by your doctor.  If you were prescribed an antibiotic medicine, take it as told by your doctor. Do not stop taking the antibiotic even if you start to feel better.  Avoid the following drinks:  Alcohol.  Caffeine.  Tea.  Carbonated drinks.  Drink enough fluid to keep your pee clear or pale yellow.  Keep all follow-up visits as told by your doctor. This is important.  Make sure to:  Empty your bladder often and completely. Do not to hold pee for long periods of time.  Empty your bladder before and after sex.  Wipe from front to back after a bowel movement if you are male. Use each tissue one time when you wipe. Contact a doctor if:  You have back pain.  You have a fever.  You feel sick to your stomach (nauseous).  You throw up (vomit).  Your symptoms do not get better after 3 days.  Your symptoms go away and then come back. Get help right away if:  You have very bad back pain.  You have very bad lower belly (abdominal) pain.  You are throwing up and cannot keep down any medicines or water. This information is not intended to replace advice given to you by your health care provider.  Make sure you discuss any questions you have with your health care provider. Document Released: 05/27/2008 Document Revised: 05/16/2016 Document Reviewed: 10/30/2015 Elsevier Interactive Patient Education  2017 Elsevier Inc.      Agustina Caroli, MD [facility]

## 2017-03-26 NOTE — Patient Instructions (Addendum)
     IF you received an x-ray today, you will receive an invoice from [facility]. Please contact [facility] at [phone] with questions or concerns regarding your invoice.   IF you received labwork today, you will receive an invoice from [facility]. Please contact [facility] at [phone] with questions or concerns regarding your invoice.   Our billing staff will not be able to assist you with questions regarding bills from these companies.  You will be contacted with the lab results as soon as they are available. The fastest way to get your results is to activate your My Chart account. Instructions are located on the last page of this paperwork. If you have not heard from us regarding the results in 2 weeks, please contact this office.      Urinary Tract Infection, Adult A urinary tract infection (UTI) is an infection of any part of the urinary tract. The urinary tract includes the:  Kidneys.  Ureters.  Bladder.  Urethra. These organs make, store, and get rid of pee (urine) in the body. Follow these instructions at home:  Take over-the-counter and prescription medicines only as told by your doctor.  If you were prescribed an antibiotic medicine, take it as told by your doctor. Do not stop taking the antibiotic even if you start to feel better.  Avoid the following drinks:  Alcohol.  Caffeine.  Tea.  Carbonated drinks.  Drink enough fluid to keep your pee clear or pale yellow.  Keep all follow-up visits as told by your doctor. This is important.  Make sure to:  Empty your bladder often and completely. Do not to hold pee for long periods of time.  Empty your bladder before and after sex.  Wipe from front to back after a bowel movement if you are male. Use each tissue one time when you wipe. Contact a doctor if:  You have back pain.  You have a fever.  You feel sick to your stomach (nauseous).  You throw up (vomit).  Your symptoms do  not get better after 3 days.  Your symptoms go away and then come back. Get help right away if:  You have very bad back pain.  You have very bad lower belly (abdominal) pain.  You are throwing up and cannot keep down any medicines or water. This information is not intended to replace advice given to you by your health care provider. Make sure you discuss any questions you have with your health care provider. Document Released: 05/27/2008 Document Revised: 05/16/2016 Document Reviewed: 10/30/2015 Elsevier Interactive Patient Education  2017 Elsevier Inc.  

## 2017-03-27 LAB — URINE CULTURE

## 2017-04-03 DIAGNOSIS — E1140 Type 2 diabetes mellitus with diabetic neuropathy, unspecified: Secondary | ICD-10-CM | POA: Diagnosis not present

## 2017-04-03 DIAGNOSIS — E785 Hyperlipidemia, unspecified: Secondary | ICD-10-CM | POA: Diagnosis not present

## 2017-04-03 DIAGNOSIS — I10 Essential (primary) hypertension: Secondary | ICD-10-CM | POA: Diagnosis not present

## 2017-04-25 ENCOUNTER — Other Ambulatory Visit: Payer: Self-pay | Admitting: Urology

## 2017-04-25 DIAGNOSIS — C61 Malignant neoplasm of prostate: Secondary | ICD-10-CM

## 2017-05-01 DIAGNOSIS — E789 Disorder of lipoprotein metabolism, unspecified: Secondary | ICD-10-CM | POA: Diagnosis not present

## 2017-05-01 DIAGNOSIS — E0789 Other specified disorders of thyroid: Secondary | ICD-10-CM | POA: Diagnosis not present

## 2017-05-01 DIAGNOSIS — E118 Type 2 diabetes mellitus with unspecified complications: Secondary | ICD-10-CM | POA: Diagnosis not present

## 2017-05-08 DIAGNOSIS — E0789 Other specified disorders of thyroid: Secondary | ICD-10-CM | POA: Diagnosis not present

## 2017-05-21 ENCOUNTER — Encounter (HOSPITAL_COMMUNITY)
Admission: RE | Admit: 2017-05-21 | Discharge: 2017-05-21 | Disposition: A | Discharge status: Home / Self Care | Payer: Medicare Other | Source: Ambulatory Visit | Attending: Urology | Admitting: Urology

## 2017-05-21 ENCOUNTER — Ambulatory Visit (HOSPITAL_COMMUNITY)
Admission: RE | Admit: 2017-05-21 | Discharge: 2017-05-21 | Disposition: A | Discharge status: Home / Self Care | Payer: Medicare Other | Source: Ambulatory Visit | Attending: Urology | Admitting: Urology

## 2017-05-21 DIAGNOSIS — R937 Abnormal findings on diagnostic imaging of other parts of musculoskeletal system: Secondary | ICD-10-CM | POA: Insufficient documentation

## 2017-05-21 DIAGNOSIS — C61 Malignant neoplasm of prostate: Secondary | ICD-10-CM | POA: Diagnosis not present

## 2017-05-21 DIAGNOSIS — Z471 Aftercare following joint replacement surgery: Secondary | ICD-10-CM | POA: Diagnosis not present

## 2017-05-21 DIAGNOSIS — Z96653 Presence of artificial knee joint, bilateral: Secondary | ICD-10-CM | POA: Diagnosis not present

## 2017-05-21 MED ORDER — TECHNETIUM TC 99M MEDRONATE IV KIT
25.0000 | PACK | Freq: Once | INTRAVENOUS | Status: DC | PRN
Start: 2017-05-21 — End: 2017-05-27

## 2017-05-28 DIAGNOSIS — C7951 Secondary malignant neoplasm of bone: Secondary | ICD-10-CM | POA: Diagnosis not present

## 2017-05-28 DIAGNOSIS — C61 Malignant neoplasm of prostate: Secondary | ICD-10-CM | POA: Diagnosis not present

## 2017-07-08 DIAGNOSIS — H4052X2 Glaucoma secondary to other eye disorders, left eye, moderate stage: Secondary | ICD-10-CM | POA: Diagnosis not present

## 2017-07-08 DIAGNOSIS — H44522 Atrophy of globe, left eye: Secondary | ICD-10-CM | POA: Diagnosis not present

## 2017-07-08 DIAGNOSIS — H43811 Vitreous degeneration, right eye: Secondary | ICD-10-CM | POA: Diagnosis not present

## 2017-07-08 DIAGNOSIS — H2012 Chronic iridocyclitis, left eye: Secondary | ICD-10-CM | POA: Diagnosis not present

## 2017-08-07 DIAGNOSIS — C61 Malignant neoplasm of prostate: Secondary | ICD-10-CM | POA: Diagnosis not present

## 2017-08-15 DIAGNOSIS — C61 Malignant neoplasm of prostate: Secondary | ICD-10-CM | POA: Diagnosis not present

## 2017-09-18 ENCOUNTER — Other Ambulatory Visit: Payer: Self-pay | Admitting: Urology

## 2017-09-18 DIAGNOSIS — C61 Malignant neoplasm of prostate: Secondary | ICD-10-CM

## 2017-10-02 DIAGNOSIS — E785 Hyperlipidemia, unspecified: Secondary | ICD-10-CM | POA: Diagnosis not present

## 2017-10-02 DIAGNOSIS — Z0000 Encounter for general adult medical examination without abnormal findings: Secondary | ICD-10-CM | POA: Diagnosis not present

## 2017-10-02 DIAGNOSIS — E118 Type 2 diabetes mellitus with unspecified complications: Secondary | ICD-10-CM | POA: Diagnosis not present

## 2017-10-07 DIAGNOSIS — M40203 Unspecified kyphosis, cervicothoracic region: Secondary | ICD-10-CM | POA: Diagnosis not present

## 2017-10-07 DIAGNOSIS — N200 Calculus of kidney: Secondary | ICD-10-CM | POA: Diagnosis not present

## 2017-10-07 DIAGNOSIS — E875 Hyperkalemia: Secondary | ICD-10-CM | POA: Diagnosis not present

## 2017-10-07 DIAGNOSIS — E1140 Type 2 diabetes mellitus with diabetic neuropathy, unspecified: Secondary | ICD-10-CM | POA: Diagnosis not present

## 2017-10-07 DIAGNOSIS — C61 Malignant neoplasm of prostate: Secondary | ICD-10-CM | POA: Diagnosis not present

## 2017-10-07 DIAGNOSIS — I10 Essential (primary) hypertension: Secondary | ICD-10-CM | POA: Diagnosis not present

## 2017-10-07 DIAGNOSIS — M47814 Spondylosis without myelopathy or radiculopathy, thoracic region: Secondary | ICD-10-CM | POA: Diagnosis not present

## 2017-10-07 DIAGNOSIS — G7000 Myasthenia gravis without (acute) exacerbation: Secondary | ICD-10-CM | POA: Diagnosis not present

## 2017-10-07 DIAGNOSIS — G629 Polyneuropathy, unspecified: Secondary | ICD-10-CM | POA: Diagnosis not present

## 2017-10-07 DIAGNOSIS — I351 Nonrheumatic aortic (valve) insufficiency: Secondary | ICD-10-CM | POA: Diagnosis not present

## 2017-10-07 DIAGNOSIS — E785 Hyperlipidemia, unspecified: Secondary | ICD-10-CM | POA: Diagnosis not present

## 2017-10-07 DIAGNOSIS — I441 Atrioventricular block, second degree: Secondary | ICD-10-CM | POA: Diagnosis not present

## 2017-10-07 DIAGNOSIS — E049 Nontoxic goiter, unspecified: Secondary | ICD-10-CM | POA: Diagnosis not present

## 2017-10-16 DIAGNOSIS — R0989 Other specified symptoms and signs involving the circulatory and respiratory systems: Secondary | ICD-10-CM | POA: Diagnosis not present

## 2017-10-16 DIAGNOSIS — I70202 Unspecified atherosclerosis of native arteries of extremities, left leg: Secondary | ICD-10-CM | POA: Diagnosis not present

## 2017-10-21 DIAGNOSIS — M859 Disorder of bone density and structure, unspecified: Secondary | ICD-10-CM | POA: Diagnosis not present

## 2017-10-21 DIAGNOSIS — M8580 Other specified disorders of bone density and structure, unspecified site: Secondary | ICD-10-CM | POA: Diagnosis not present

## 2017-11-27 ENCOUNTER — Encounter (HOSPITAL_COMMUNITY)
Admission: RE | Admit: 2017-11-27 | Discharge: 2017-11-27 | Disposition: A | Discharge status: Home / Self Care | Payer: Medicare Other | Source: Ambulatory Visit | Attending: Urology | Admitting: Urology

## 2017-11-27 DIAGNOSIS — C61 Malignant neoplasm of prostate: Secondary | ICD-10-CM | POA: Insufficient documentation

## 2017-11-27 DIAGNOSIS — R972 Elevated prostate specific antigen [PSA]: Secondary | ICD-10-CM | POA: Diagnosis not present

## 2017-11-27 MED ORDER — TECHNETIUM TC 99M MEDRONATE IV KIT
21.3000 | PACK | Freq: Once | INTRAVENOUS | Status: AC | PRN
  Administered 2017-11-27: 21.3 via INTRAVENOUS

## 2017-12-05 DIAGNOSIS — C61 Malignant neoplasm of prostate: Secondary | ICD-10-CM | POA: Diagnosis not present

## 2018-01-08 DIAGNOSIS — C61 Malignant neoplasm of prostate: Secondary | ICD-10-CM | POA: Diagnosis not present

## 2018-01-08 DIAGNOSIS — C7951 Secondary malignant neoplasm of bone: Secondary | ICD-10-CM | POA: Diagnosis not present

## 2018-01-16 DIAGNOSIS — C7951 Secondary malignant neoplasm of bone: Secondary | ICD-10-CM | POA: Diagnosis not present

## 2018-01-16 DIAGNOSIS — R9721 Rising PSA following treatment for malignant neoplasm of prostate: Secondary | ICD-10-CM | POA: Diagnosis not present

## 2018-01-16 DIAGNOSIS — C7919 Secondary malignant neoplasm of other urinary organs: Secondary | ICD-10-CM | POA: Diagnosis not present

## 2018-01-16 DIAGNOSIS — C61 Malignant neoplasm of prostate: Secondary | ICD-10-CM | POA: Diagnosis not present

## 2018-03-10 DIAGNOSIS — H44522 Atrophy of globe, left eye: Secondary | ICD-10-CM | POA: Diagnosis not present

## 2018-03-10 DIAGNOSIS — H43811 Vitreous degeneration, right eye: Secondary | ICD-10-CM | POA: Diagnosis not present

## 2018-03-10 DIAGNOSIS — H4052X2 Glaucoma secondary to other eye disorders, left eye, moderate stage: Secondary | ICD-10-CM | POA: Diagnosis not present

## 2018-03-10 DIAGNOSIS — H35371 Puckering of macula, right eye: Secondary | ICD-10-CM | POA: Diagnosis not present

## 2018-03-10 DIAGNOSIS — H2012 Chronic iridocyclitis, left eye: Secondary | ICD-10-CM | POA: Diagnosis not present

## 2018-05-15 DIAGNOSIS — C61 Malignant neoplasm of prostate: Secondary | ICD-10-CM | POA: Diagnosis not present

## 2018-05-22 DIAGNOSIS — C61 Malignant neoplasm of prostate: Secondary | ICD-10-CM | POA: Diagnosis not present

## 2018-05-22 DIAGNOSIS — Z5111 Encounter for antineoplastic chemotherapy: Secondary | ICD-10-CM | POA: Diagnosis not present

## 2018-05-22 DIAGNOSIS — C7951 Secondary malignant neoplasm of bone: Secondary | ICD-10-CM | POA: Diagnosis not present

## 2018-05-22 DIAGNOSIS — C7919 Secondary malignant neoplasm of other urinary organs: Secondary | ICD-10-CM | POA: Diagnosis not present

## 2018-06-23 DIAGNOSIS — C61 Malignant neoplasm of prostate: Secondary | ICD-10-CM | POA: Diagnosis not present

## 2018-06-23 DIAGNOSIS — C7951 Secondary malignant neoplasm of bone: Secondary | ICD-10-CM | POA: Diagnosis not present

## 2018-07-28 DIAGNOSIS — C7951 Secondary malignant neoplasm of bone: Secondary | ICD-10-CM | POA: Diagnosis not present

## 2018-07-28 DIAGNOSIS — C61 Malignant neoplasm of prostate: Secondary | ICD-10-CM | POA: Diagnosis not present

## 2018-09-01 DIAGNOSIS — C7951 Secondary malignant neoplasm of bone: Secondary | ICD-10-CM | POA: Diagnosis not present

## 2018-09-01 DIAGNOSIS — C61 Malignant neoplasm of prostate: Secondary | ICD-10-CM | POA: Diagnosis not present

## 2018-09-18 DIAGNOSIS — C61 Malignant neoplasm of prostate: Secondary | ICD-10-CM | POA: Diagnosis not present

## 2018-09-23 ENCOUNTER — Other Ambulatory Visit: Payer: Self-pay | Admitting: Urology

## 2018-09-23 DIAGNOSIS — C61 Malignant neoplasm of prostate: Secondary | ICD-10-CM | POA: Diagnosis not present

## 2018-09-23 DIAGNOSIS — Z79899 Other long term (current) drug therapy: Secondary | ICD-10-CM | POA: Diagnosis not present

## 2018-09-23 DIAGNOSIS — Z5111 Encounter for antineoplastic chemotherapy: Secondary | ICD-10-CM | POA: Diagnosis not present

## 2018-09-23 DIAGNOSIS — R9721 Rising PSA following treatment for malignant neoplasm of prostate: Secondary | ICD-10-CM | POA: Diagnosis not present

## 2018-10-09 ENCOUNTER — Encounter (HOSPITAL_COMMUNITY)
Admission: RE | Admit: 2018-10-09 | Discharge: 2018-10-09 | Disposition: A | Discharge status: Home / Self Care | Payer: Medicare Other | Source: Ambulatory Visit | Attending: Urology | Admitting: Urology

## 2018-10-09 DIAGNOSIS — C61 Malignant neoplasm of prostate: Secondary | ICD-10-CM | POA: Insufficient documentation

## 2018-10-09 DIAGNOSIS — K8020 Calculus of gallbladder without cholecystitis without obstruction: Secondary | ICD-10-CM | POA: Diagnosis not present

## 2018-10-09 MED ORDER — TECHNETIUM TC 99M MEDRONATE IV KIT
20.0000 | PACK | Freq: Once | INTRAVENOUS | Status: AC | PRN
  Administered 2018-10-09: 21.4 via INTRAVENOUS

## 2018-10-12 DIAGNOSIS — E119 Type 2 diabetes mellitus without complications: Secondary | ICD-10-CM | POA: Diagnosis not present

## 2018-10-12 DIAGNOSIS — E785 Hyperlipidemia, unspecified: Secondary | ICD-10-CM | POA: Diagnosis not present

## 2018-10-12 DIAGNOSIS — E039 Hypothyroidism, unspecified: Secondary | ICD-10-CM | POA: Diagnosis not present

## 2018-10-12 DIAGNOSIS — I10 Essential (primary) hypertension: Secondary | ICD-10-CM | POA: Diagnosis not present

## 2018-10-14 DIAGNOSIS — G7000 Myasthenia gravis without (acute) exacerbation: Secondary | ICD-10-CM | POA: Diagnosis not present

## 2018-10-14 DIAGNOSIS — E785 Hyperlipidemia, unspecified: Secondary | ICD-10-CM | POA: Diagnosis not present

## 2018-10-14 DIAGNOSIS — Z7982 Long term (current) use of aspirin: Secondary | ICD-10-CM | POA: Diagnosis not present

## 2018-10-14 DIAGNOSIS — E1140 Type 2 diabetes mellitus with diabetic neuropathy, unspecified: Secondary | ICD-10-CM | POA: Diagnosis not present

## 2018-10-14 DIAGNOSIS — E049 Nontoxic goiter, unspecified: Secondary | ICD-10-CM | POA: Diagnosis not present

## 2018-10-14 DIAGNOSIS — E039 Hypothyroidism, unspecified: Secondary | ICD-10-CM | POA: Diagnosis not present

## 2018-10-14 DIAGNOSIS — Z0000 Encounter for general adult medical examination without abnormal findings: Secondary | ICD-10-CM | POA: Diagnosis not present

## 2018-10-14 DIAGNOSIS — C61 Malignant neoplasm of prostate: Secondary | ICD-10-CM | POA: Diagnosis not present

## 2018-10-14 DIAGNOSIS — C7951 Secondary malignant neoplasm of bone: Secondary | ICD-10-CM | POA: Diagnosis not present

## 2018-10-14 DIAGNOSIS — E1142 Type 2 diabetes mellitus with diabetic polyneuropathy: Secondary | ICD-10-CM | POA: Diagnosis not present

## 2018-10-14 DIAGNOSIS — I10 Essential (primary) hypertension: Secondary | ICD-10-CM | POA: Diagnosis not present

## 2018-10-25 ENCOUNTER — Other Ambulatory Visit: Payer: Self-pay

## 2018-10-25 ENCOUNTER — Emergency Department (HOSPITAL_COMMUNITY): Payer: Medicare Other

## 2018-10-25 ENCOUNTER — Inpatient Hospital Stay (HOSPITAL_COMMUNITY)
Admission: EM | Admit: 2018-10-25 | Discharge: 2018-10-28 | DRG: 280 | Disposition: A | Discharge status: Home Health | Payer: Medicare Other | Attending: Internal Medicine | Admitting: Internal Medicine

## 2018-10-25 ENCOUNTER — Encounter (HOSPITAL_COMMUNITY): Payer: Self-pay

## 2018-10-25 DIAGNOSIS — S2231XA Fracture of one rib, right side, initial encounter for closed fracture: Secondary | ICD-10-CM | POA: Diagnosis present

## 2018-10-25 DIAGNOSIS — R918 Other nonspecific abnormal finding of lung field: Secondary | ICD-10-CM | POA: Diagnosis present

## 2018-10-25 DIAGNOSIS — I5021 Acute systolic (congestive) heart failure: Secondary | ICD-10-CM | POA: Diagnosis present

## 2018-10-25 DIAGNOSIS — I214 Non-ST elevation (NSTEMI) myocardial infarction: Secondary | ICD-10-CM | POA: Diagnosis not present

## 2018-10-25 DIAGNOSIS — H409 Unspecified glaucoma: Secondary | ICD-10-CM | POA: Diagnosis present

## 2018-10-25 DIAGNOSIS — I452 Bifascicular block: Secondary | ICD-10-CM | POA: Diagnosis not present

## 2018-10-25 DIAGNOSIS — Z7984 Long term (current) use of oral hypoglycemic drugs: Secondary | ICD-10-CM

## 2018-10-25 DIAGNOSIS — R0602 Shortness of breath: Secondary | ICD-10-CM | POA: Diagnosis not present

## 2018-10-25 DIAGNOSIS — E871 Hypo-osmolality and hyponatremia: Secondary | ICD-10-CM | POA: Diagnosis not present

## 2018-10-25 DIAGNOSIS — E785 Hyperlipidemia, unspecified: Secondary | ICD-10-CM | POA: Diagnosis present

## 2018-10-25 DIAGNOSIS — H5462 Unqualified visual loss, left eye, normal vision right eye: Secondary | ICD-10-CM | POA: Diagnosis present

## 2018-10-25 DIAGNOSIS — R0781 Pleurodynia: Secondary | ICD-10-CM | POA: Diagnosis not present

## 2018-10-25 DIAGNOSIS — Z66 Do not resuscitate: Secondary | ICD-10-CM | POA: Diagnosis present

## 2018-10-25 DIAGNOSIS — I4891 Unspecified atrial fibrillation: Secondary | ICD-10-CM | POA: Diagnosis present

## 2018-10-25 DIAGNOSIS — Z79899 Other long term (current) drug therapy: Secondary | ICD-10-CM

## 2018-10-25 DIAGNOSIS — N179 Acute kidney failure, unspecified: Secondary | ICD-10-CM | POA: Diagnosis not present

## 2018-10-25 DIAGNOSIS — I10 Essential (primary) hypertension: Secondary | ICD-10-CM | POA: Diagnosis present

## 2018-10-25 DIAGNOSIS — E119 Type 2 diabetes mellitus without complications: Secondary | ICD-10-CM

## 2018-10-25 DIAGNOSIS — J9811 Atelectasis: Secondary | ICD-10-CM | POA: Diagnosis not present

## 2018-10-25 DIAGNOSIS — R0902 Hypoxemia: Secondary | ICD-10-CM | POA: Diagnosis not present

## 2018-10-25 DIAGNOSIS — I429 Cardiomyopathy, unspecified: Secondary | ICD-10-CM | POA: Diagnosis present

## 2018-10-25 DIAGNOSIS — J189 Pneumonia, unspecified organism: Secondary | ICD-10-CM | POA: Diagnosis present

## 2018-10-25 DIAGNOSIS — Y92002 Bathroom of unspecified non-institutional (private) residence single-family (private) house as the place of occurrence of the external cause: Secondary | ICD-10-CM

## 2018-10-25 DIAGNOSIS — N400 Enlarged prostate without lower urinary tract symptoms: Secondary | ICD-10-CM | POA: Diagnosis present

## 2018-10-25 DIAGNOSIS — Z7982 Long term (current) use of aspirin: Secondary | ICD-10-CM

## 2018-10-25 DIAGNOSIS — Z8546 Personal history of malignant neoplasm of prostate: Secondary | ICD-10-CM

## 2018-10-25 DIAGNOSIS — E0590 Thyrotoxicosis, unspecified without thyrotoxic crisis or storm: Secondary | ICD-10-CM | POA: Diagnosis present

## 2018-10-25 DIAGNOSIS — R05 Cough: Secondary | ICD-10-CM | POA: Diagnosis not present

## 2018-10-25 DIAGNOSIS — I252 Old myocardial infarction: Secondary | ICD-10-CM

## 2018-10-25 DIAGNOSIS — R531 Weakness: Secondary | ICD-10-CM | POA: Diagnosis not present

## 2018-10-25 DIAGNOSIS — S299XXA Unspecified injury of thorax, initial encounter: Secondary | ICD-10-CM | POA: Diagnosis not present

## 2018-10-25 DIAGNOSIS — D539 Nutritional anemia, unspecified: Secondary | ICD-10-CM | POA: Diagnosis present

## 2018-10-25 DIAGNOSIS — W010XXA Fall on same level from slipping, tripping and stumbling without subsequent striking against object, initial encounter: Secondary | ICD-10-CM | POA: Diagnosis present

## 2018-10-25 DIAGNOSIS — I110 Hypertensive heart disease with heart failure: Secondary | ICD-10-CM | POA: Diagnosis present

## 2018-10-25 HISTORY — DX: Fracture of one rib, unspecified side, initial encounter for closed fracture: S22.39XA

## 2018-10-25 HISTORY — DX: Pneumonia, unspecified organism: J18.9

## 2018-10-25 HISTORY — DX: Non-ST elevation (NSTEMI) myocardial infarction: I21.4

## 2018-10-25 LAB — CBC WITH DIFFERENTIAL/PLATELET
Abs Immature Granulocytes: 0.17 10*3/uL — ABNORMAL HIGH (ref 0.00–0.07)
BASOS PCT: 0 %
Basophils Absolute: 0 10*3/uL (ref 0.0–0.1)
EOS ABS: 0 10*3/uL (ref 0.0–0.5)
EOS PCT: 0 %
HEMATOCRIT: 36.2 % — AB (ref 39.0–52.0)
Hemoglobin: 11.7 g/dL — ABNORMAL LOW (ref 13.0–17.0)
IMMATURE GRANULOCYTES: 1 %
Lymphocytes Relative: 3 %
Lymphs Abs: 0.4 10*3/uL — ABNORMAL LOW (ref 0.7–4.0)
MCH: 33 pg (ref 26.0–34.0)
MCHC: 32.3 g/dL (ref 30.0–36.0)
MCV: 102 fL — AB (ref 80.0–100.0)
MONO ABS: 1.7 10*3/uL — AB (ref 0.1–1.0)
Monocytes Relative: 9 %
NEUTROS ABS: 15.6 10*3/uL — AB (ref 1.7–7.7)
Neutrophils Relative %: 87 %
Platelets: 202 10*3/uL (ref 150–400)
RBC: 3.55 MIL/uL — AB (ref 4.22–5.81)
RDW: 12.9 % (ref 11.5–15.5)
WBC: 17.9 10*3/uL — AB (ref 4.0–10.5)
nRBC: 0 % (ref 0.0–0.2)

## 2018-10-25 LAB — URINALYSIS, ROUTINE W REFLEX MICROSCOPIC
BACTERIA UA: NONE SEEN
Bilirubin Urine: NEGATIVE
Glucose, UA: 50 mg/dL — AB
Hgb urine dipstick: NEGATIVE
Ketones, ur: NEGATIVE mg/dL
Leukocytes, UA: NEGATIVE
Nitrite: NEGATIVE
PROTEIN: 30 mg/dL — AB
SPECIFIC GRAVITY, URINE: 1.018 (ref 1.005–1.030)
pH: 5 (ref 5.0–8.0)

## 2018-10-25 LAB — COMPREHENSIVE METABOLIC PANEL
ALT: 26 U/L (ref 0–44)
AST: 33 U/L (ref 15–41)
Albumin: 2.5 g/dL — ABNORMAL LOW (ref 3.5–5.0)
Alkaline Phosphatase: 59 U/L (ref 38–126)
Anion gap: 11 (ref 5–15)
BILIRUBIN TOTAL: 1 mg/dL (ref 0.3–1.2)
BUN: 52 mg/dL — AB (ref 8–23)
CO2: 20 mmol/L — ABNORMAL LOW (ref 22–32)
Calcium: 7.9 mg/dL — ABNORMAL LOW (ref 8.9–10.3)
Chloride: 99 mmol/L (ref 98–111)
Creatinine, Ser: 1.41 mg/dL — ABNORMAL HIGH (ref 0.61–1.24)
GFR calc Af Amer: 48 mL/min — ABNORMAL LOW (ref >60)
GFR, EST NON AFRICAN AMERICAN: 41 mL/min — AB (ref >60)
Glucose, Bld: 222 mg/dL — ABNORMAL HIGH (ref 70–99)
POTASSIUM: 3.5 mmol/L (ref 3.5–5.1)
Sodium: 130 mmol/L — ABNORMAL LOW (ref 135–145)
TOTAL PROTEIN: 5.7 g/dL — AB (ref 6.5–8.1)

## 2018-10-25 LAB — I-STAT TROPONIN, ED: TROPONIN I, POC: 0.67 ng/mL — AB (ref 0.00–0.08)

## 2018-10-25 LAB — I-STAT CG4 LACTIC ACID, ED
LACTIC ACID, VENOUS: 1.29 mmol/L (ref 0.5–1.9)
Lactic Acid, Venous: 1.45 mmol/L (ref 0.5–1.9)

## 2018-10-25 LAB — CK: Total CK: 200 U/L (ref 49–397)

## 2018-10-25 MED ORDER — TIMOLOL MALEATE 0.5 % OP SOLN
1.0000 [drp] | Freq: Every day | OPHTHALMIC | Status: DC
  Administered 2018-10-26 – 2018-10-27 (×3): 1 [drp] via OPHTHALMIC
  Filled 2018-10-25: qty 5

## 2018-10-25 MED ORDER — INSULIN ASPART 100 UNIT/ML ~~LOC~~ SOLN: 0.0000 [IU] | Freq: Three times a day (TID) | SUBCUTANEOUS | Status: DC

## 2018-10-25 MED ORDER — ACETAMINOPHEN 650 MG RE SUPP: 650.0000 mg | Freq: Four times a day (QID) | RECTAL | Status: DC | PRN

## 2018-10-25 MED ORDER — SODIUM CHLORIDE 0.9 % IV SOLN
INTRAVENOUS | Status: AC
  Administered 2018-10-26: 02:00:00 via INTRAVENOUS

## 2018-10-25 MED ORDER — SODIUM CHLORIDE 0.9 % IV SOLN
500.0000 mg | Freq: Once | INTRAVENOUS | Status: AC
  Administered 2018-10-25: 500 mg via INTRAVENOUS
  Filled 2018-10-25: qty 500

## 2018-10-25 MED ORDER — SODIUM CHLORIDE 0.9 % IV SOLN
1.0000 g | Freq: Once | INTRAVENOUS | Status: AC
  Administered 2018-10-25: 1 g via INTRAVENOUS
  Filled 2018-10-25: qty 10

## 2018-10-25 MED ORDER — HYDRALAZINE HCL 20 MG/ML IJ SOLN: 10.0000 mg | INTRAMUSCULAR | Status: DC | PRN

## 2018-10-25 MED ORDER — SODIUM CHLORIDE 0.9 % IV SOLN
INTRAVENOUS | Status: DC | PRN
  Administered 2018-10-25: 250 mL via INTRAVENOUS
  Administered 2018-10-25: 500 mL via INTRAVENOUS

## 2018-10-25 MED ORDER — BRIMONIDINE TARTRATE 0.15 % OP SOLN
1.0000 [drp] | Freq: Every day | OPHTHALMIC | Status: DC
  Administered 2018-10-26 – 2018-10-27 (×3): 1 [drp] via OPHTHALMIC
  Filled 2018-10-25: qty 5

## 2018-10-25 MED ORDER — ATORVASTATIN CALCIUM 20 MG PO TABS
20.0000 mg | ORAL_TABLET | Freq: Every day | ORAL | Status: DC
  Administered 2018-10-26 – 2018-10-27 (×3): 20 mg via ORAL
  Filled 2018-10-25: qty 2
  Filled 2018-10-25 (×2): qty 1

## 2018-10-25 MED ORDER — ONDANSETRON HCL 4 MG/2ML IJ SOLN
4.0000 mg | Freq: Four times a day (QID) | INTRAMUSCULAR | Status: DC | PRN
  Filled 2018-10-25: qty 2

## 2018-10-25 MED ORDER — SODIUM CHLORIDE 0.9 % IV SOLN
500.0000 mg | Freq: Once | INTRAVENOUS | Status: DC
  Filled 2018-10-25: qty 500

## 2018-10-25 MED ORDER — ONDANSETRON HCL 4 MG PO TABS: 4.0000 mg | ORAL_TABLET | Freq: Four times a day (QID) | ORAL | Status: DC | PRN

## 2018-10-25 MED ORDER — SODIUM CHLORIDE 0.9 % IV BOLUS
1000.0000 mL | Freq: Once | INTRAVENOUS | Status: AC
  Administered 2018-10-25: 1000 mL via INTRAVENOUS

## 2018-10-25 MED ORDER — ASPIRIN EC 81 MG PO TBEC
81.0000 mg | DELAYED_RELEASE_TABLET | Freq: Every day | ORAL | Status: DC
  Administered 2018-10-26 – 2018-10-28 (×4): 81 mg via ORAL
  Filled 2018-10-25 (×4): qty 1

## 2018-10-25 MED ORDER — ASPIRIN 81 MG PO CHEW
324.0000 mg | CHEWABLE_TABLET | Freq: Once | ORAL | Status: AC
  Administered 2018-10-25: 324 mg via ORAL
  Filled 2018-10-25: qty 4

## 2018-10-25 MED ORDER — ACETAMINOPHEN 325 MG PO TABS: 650.0000 mg | ORAL_TABLET | Freq: Four times a day (QID) | ORAL | Status: DC | PRN

## 2018-10-25 NOTE — ED Triage Notes (Signed)
To room via EMS.  Onset 2 days ago pt fell in bathroom, got wedged between bathtub and toilet, EMS had to assist pt up.  Pain to right ribs, bruising noted to right lateral lower side.  Abrasions to bilateral knees.  Pain worse when moving.    Onset several days nonproductive cough and fever.  Daughter  Concerned with cough, weakness, fever.  Per daughter temperature 100.0, EMS gave Tylenol.    EMS BP 124/60  HR 80-100  EKG A fib SpO2 92% on RA, O2 @ 2L SpO2 incrased to 95% RR 22

## 2018-10-25 NOTE — ED Notes (Signed)
Bilateral pedal pulses positive with doppler.

## 2018-10-25 NOTE — H&P (Signed)
History and Physical    NAJIB COLMENARES PPI:[id number] DOB: [birth year] DOA: 10/25/2018  PCP: Deland Pretty, MD  Patient coming from: Home.  Chief Complaint: Fever and cough.  HPI: [name] is a 84 y.o. male with history of diabetes mellitus type 2, hypertension, hyperlipidemia, left eye blind, prostate cancer was brought to the ER after patient was found to have fever and cough.  Patient had a fall at home 2 days ago in his bathroom after he slipped but did not hit his head but fell in between the commode in the bathtub and was lost the first 6 hours.  Patient's daughter called EMS when she was not able to reach patient through the phone.  Patient was eased out of the position by the EMS.  Since then patient has been having some pain around the ribs.  This morning patient's daughter when visiting from the patient was having a fever of 3 F with some productive cough which has been persistent.  Patient also was complaining of pain in the right rib area on coughing.  Patient has been having multiple bruises in the abdomen knee.  Patient denies losing consciousness or hitting his head during the fall.  ED Course: In the ER x-rays revealed right rib fracture.  Exact numbers not noted.  Bilateral atelectasis but patient on exam has productive cough and appears wet.  EKG shows atrial fibrillation which is new onset.  Also there is EKG finding concerning for lateral ischemia is confirmed with after discussing with Dr. Ellyn Hack on-call cardiologist by me.  Troponin is mildly elevated.  Patient has leukocytosis.  Cultures were sent and started on empiric antibiotics for pneumonia.  Review of Systems: As per HPI, rest all negative.   Past Medical History:  Diagnosis Date  . Cataract   . Diabetes mellitus without complication ([place])   . Glaucoma   . Hypertension   . prostate ca dx'd 2008   seed implant  . Retinal detachment    of left eye with laser surgery    Past Surgical History:    Procedure Laterality Date  . EYE SURGERY    . HERNIA REPAIR    . JOINT REPLACEMENT    . PROSTATE SURGERY       reports that he has never smoked. He has never used smokeless tobacco. He reports that he does not drink alcohol or use drugs.  No Known Allergies  Family History  Problem Relation Age of Onset  . Heart disease Mother   . Cancer Father     Prior to Admission medications   Medication Sig Start Date End Date Taking? Authorizing Provider  atorvastatin (LIPITOR) 20 MG tablet Take 20 mg by mouth at bedtime.    Yes [provider]  brimonidine (ALPHAGAN P) 0.1 % SOLN Place 1 drop into the left eye at bedtime.   Yes [provider]  losartan (COZAAR) 50 MG tablet Take 50 mg by mouth at bedtime.  06/19/13  Yes [provider]  metFORMIN (GLUCOPHAGE-XR) 500 MG 24 hr tablet Take 1,000 mg by mouth at bedtime. 10/07/18  Yes [provider]  Polyvinyl Alcohol-Povidone (REFRESH OP) Place 1 drop into both eyes at bedtime.   Yes [provider]  PRESCRIPTION MEDICATION Injection to lower testosterone levels - administered by Dr. Diona Fanti [facility] once every 4 months. Last injection October 2019   Yes [provider]  timolol (TIMOPTIC) 0.5 % ophthalmic solution Place 1 drop into the left eye at  bedtime.  06/18/13  Yes [provider]    Physical Exam: Vitals:   10/25/18 1924 10/25/18 1926 10/25/18 2057 10/25/18 2306  BP:    (!) 102/49  Pulse:   75 71  Resp:   (!) 24 20  Temp: 99.3 F (37.4 C)  98.2 F (36.8 C) 98.2 F (36.8 C)  TempSrc: Rectal  Oral Oral  SpO2:   97% 92%  Weight:  79.4 kg  85 kg  Height:  5\' 8"  (1.727 m)  5\' 8"  (1.727 m)      Constitutional: Moderately built and nourished. Vitals:   10/25/18 1924 10/25/18 1926 10/25/18 2057 10/25/18 2306  BP:    (!) 102/49  Pulse:   75 71  Resp:   (!) 24 20  Temp: 99.3 F (37.4 C)  98.2 F (36.8 C) 98.2 F (36.8 C)  TempSrc: Rectal  Oral Oral   SpO2:   97% 92%  Weight:  79.4 kg  85 kg  Height:  5\' 8"  (1.727 m)  5\' 8"  (1.727 m)   Eyes: Anicteric no pallor.  Left eye blind. ENMT: No discharge from the ears eyes nose or mouth. Neck: No mass felt.  No neck rigidity.  No JVD appreciated. Respiratory: Coarse crepitations no rhonchi. Cardiovascular: S1-S2 heard no murmurs appreciated. Abdomen: Soft nontender bowel sounds present. Musculoskeletal: Bruise on both knees.  No edema. Skin: Multiple ecchymotic areas on the abdomen and lower extremities. Neurologic: Alert awake oriented to time place and person.  Moves all extremities. Psychiatric: Appears normal per normal affect.   Labs on Admission: I have personally reviewed following labs and imaging studies  CBC: Recent Labs  Lab 10/25/18 1920  WBC 17.9*  NEUTROABS 15.6*  HGB 11.7*  HCT 36.2*  MCV 102.0*  PLT 240   Basic Metabolic Panel: Recent Labs  Lab 10/25/18 1920  NA 130*  K 3.5  CL 99  CO2 20*  GLUCOSE 222*  BUN 52*  CREATININE 1.41*  CALCIUM 7.9*   GFR: Estimated Creatinine Clearance: 34.7 mL/min (A) (by C-G formula based on SCr of 1.41 mg/dL (H)). Liver Function Tests: Recent Labs  Lab 10/25/18 1920  AST 33  ALT 26  ALKPHOS 59  BILITOT 1.0  PROT 5.7*  ALBUMIN 2.5*   No results for input(s): LIPASE, AMYLASE in the last 168 hours. No results for input(s): AMMONIA in the last 168 hours. Coagulation Profile: No results for input(s): INR, PROTIME in the last 168 hours. Cardiac Enzymes: Recent Labs  Lab 10/25/18 1910  CKTOTAL 200   BNP (last 3 results) No results for input(s): PROBNP in the last 8760 hours. HbA1C: No results for input(s): HGBA1C in the last 72 hours. CBG: No results for input(s): GLUCAP in the last 168 hours. Lipid Profile: No results for input(s): CHOL, HDL, LDLCALC, TRIG, CHOLHDL, LDLDIRECT in the last 72 hours. Thyroid Function Tests: No results for input(s): TSH, T4TOTAL, FREET4, T3FREE, THYROIDAB in the last 72  hours. Anemia Panel: No results for input(s): VITAMINB12, FOLATE, FERRITIN, TIBC, IRON, RETICCTPCT in the last 72 hours. Urine analysis:    Component Value Date/Time   COLORURINE AMBER (A) 10/25/2018 2112   APPEARANCEUR HAZY (A) 10/25/2018 2112   LABSPEC 1.018 10/25/2018 2112   PHURINE 5.0 10/25/2018 2112   GLUCOSEU 50 (A) 10/25/2018 2112   HGBUR NEGATIVE 10/25/2018 2112   BILIRUBINUR NEGATIVE 10/25/2018 2112   BILIRUBINUR negative 03/26/2017 0847   BILIRUBINUR neg 09/09/2014 1412   KETONESUR NEGATIVE 10/25/2018 2112   PROTEINUR 30 (A)  10/25/2018 2112   UROBILINOGEN 0.2 03/26/2017 0847   UROBILINOGEN 0.2 04/15/2010 0013   NITRITE NEGATIVE 10/25/2018 2112   LEUKOCYTESUR NEGATIVE 10/25/2018 2112   Sepsis Labs: @LABRCNTIP (procalcitonin:4,lacticidven:4) )No results found for this or any previous visit (from the past 240 hour(s)).   Radiological Exams on Admission: Dg Ribs Unilateral W/chest Right  Result Date: 10/25/2018 CLINICAL DATA:  Fall 2 days ago.  Pain to lower ribs. EXAM: RIGHT RIBS AND CHEST - 3+ VIEW COMPARISON:  Chest x-ray October 25, 2014 FINDINGS: Elevation right hemidiaphragm persists. The heart, hila, and mediastinum are unchanged. Mild opacity in the bases, right greater than left. No overt edema. No other acute abnormalities. Apparent lateral right rib fracture at the site of pain. IMPRESSION: Right-sided rib fracture without pneumothorax. Right greater than left bibasilar opacities favored represent atelectasis rather than infiltrate. Electronically Signed   By: Dorise Bullion III M.D   On: 10/25/2018 19:48    EKG: Independently reviewed.  A. fib with lateral wall ischemic changes.  Assessment/Plan Principal Problem:   CAP (community acquired pneumonia) Active Problems:   DM (diabetes mellitus) ([place])   Essential hypertension   BPH (benign prostatic hyperplasia)   Multiple pulmonary nodules   NSTEMI (non-ST elevated myocardial infarction) ([place])   Closed  fracture of one rib of right side   AKI (acute kidney injury) ([place])   New onset atrial fibrillation ([place])    1. Community-acquired pneumonia -patient symptoms are concerning for pneumonia.  Could be community-acquired versus aspiration.  Patient has been placed on ceftriaxone and Zithromax follow cultures sputum cultures influenza PCR. 2. Non-ST elevation MI -discussed the EKG with Dr. Ellyn Hack on-call cardiologist EKG findings are concerning for ischemia in the lateral walls.  2D echo has been ordered patient is on statins and aspirin.  Heart rate is in the 70s and blood pressure in the low normal.  For now I have not added beta-blocker but closely monitor in telemetry and if heart rate was significantly high we will add beta-blockers.  We will cycle cardiac markers. 3. New onset atrial fibrillation -patient's heart rate is presently controlled.  Chads 2 vasc score is 4.  Patient usually goes to the gym.  Likely needs to be on anticoagulation.  Will start after CAT scan of the abdomen and pelvis does not show any significant bleed or hematoma. 4. Right rib fracture status post fall -this patient has significant ecchymotic areas in the abdomen also I have ordered CT of the chest and abdomen.  Incentive spirometer.  Physical therapy consult. 5. Renal failure could be acute old labs are available in 2011.  Will hydrate for now hold ARB.  Closely follow intake output metabolic panel. 6. Macrocytic anemia check anemia panel follow CBC. 7. History of process cancer being followed by Dr. Diona Fanti. 8. Diabetes mellitus type 2 we will keep patient on sliding scale coverage while inpatient. 9. Hypertension we will keep patient on PRN IV hydralazine for now.  Hold ARB due to renal failure.   DVT prophylaxis: SCDs for now but likely will need anticoagulation for A. fib.  Awaiting CT results. Code Status: DNR. Family Communication: Patient's daughter. Disposition Plan: To be determined. Consults called:  Cardiology. Admission status: Observation.   Rise Patience MD [facility] Pager [phone].  If 7PM-7AM, please contact night-coverage [email or web address] Password [email or web address]  10/25/2018, 11:51 PM

## 2018-10-25 NOTE — ED Provider Notes (Signed)
[facility] EMERGENCY DEPARTMENT Provider Note   CSN: [id number] Arrival date & time: 10/25/18  1750     History   Chief Complaint Chief Complaint  Patient presents with  . Rib Injury  . Fever    HPI Geoffrey Bennett is a 84 y.o. male.  HPI   Patient is a 84 year old male with a history of cataracts, left eye blindness, T2DM, glaucoma, hypertension, prostate cancer, pulmonary nodules, who presents the emergency department today for evaluation of right rib pain and a fever.  Patient's daughter at bedside assist with a history.  Patient states that he slipped and fell in the bathroom 2 days ago onto his right side.  Denies head trauma or LOC.  States he was wedged in between the commode in the bathtub for about 6 hours before anyone was able to help assist him.  States he has had pain to the right ribs and bruising to the right ribs and abdomen since then.  Has had some shortness of breath, cough and pain with inspiration.  He has also had generalized weakness for the last several days.  Patient denies neck pain, back pain, bilateral knee pain.  Denies unilateral numbness/weakness.  Daughter at bedside denies any new confusion and states patient is at his baseline mental status.  He lives alone and she lives down the street.  EMS noted that patient was in A. fib on his EKG.  He was satting at 92% on room air, they placed 2 L nasal cannula and sats increased to 95%.  He was noted to have a temperature of 100 Fahrenheit.  He was given Tylenol in route.  Blood pressure stable and heart rate between 80 and 100.  Respiratory rate 22.  Past Medical History:  Diagnosis Date  . Cataract   . Diabetes mellitus without complication ([place])   . Glaucoma   . Hypertension   . prostate ca dx'd 2008   seed implant  . Retinal detachment    of left eye with laser surgery    Patient Active Problem List   Diagnosis Date Noted  . Second degree AV block, Mobitz type I 10/04/2016  .  Trifascicular block 09/21/2015  . Multiple pulmonary nodules 07/27/2014  . Aortic insufficiency 08/25/2013  . Arrhythmia 08/25/2013  . Arrhythmia, atrial 07/28/2013  . Hyperlipidemia 07/28/2013  . DM (diabetes mellitus) ([place]) 03/22/2013  . Essential hypertension 03/22/2013  . BPH (benign prostatic hyperplasia) 03/22/2013  . Hearing loss 03/22/2013    Past Surgical History:  Procedure Laterality Date  . EYE SURGERY    . HERNIA REPAIR    . JOINT REPLACEMENT    . PROSTATE SURGERY          Home Medications    Prior to Admission medications   Medication Sig Start Date End Date Taking? Authorizing Provider  atorvastatin (LIPITOR) 20 MG tablet Take 20 mg by mouth at bedtime.    Yes [provider]  brimonidine (ALPHAGAN P) 0.1 % SOLN Place 1 drop into the left eye at bedtime.   Yes [provider]  losartan (COZAAR) 50 MG tablet Take 50 mg by mouth at bedtime.  06/19/13  Yes [provider]  metFORMIN (GLUCOPHAGE-XR) 500 MG 24 hr tablet Take 1,000 mg by mouth at bedtime. 10/07/18  Yes [provider]  Polyvinyl Alcohol-Povidone (REFRESH OP) Place 1 drop into both eyes at bedtime.   Yes [provider]  PRESCRIPTION MEDICATION Injection to lower testosterone levels - administered by Dr.  [facility] once every 4 months. Last injection October 2019   Yes [provider]  timolol (TIMOPTIC) 0.5 % ophthalmic solution Place 1 drop into the left eye at bedtime.  06/18/13  Yes [provider]    Family History Family History  Problem Relation Age of Onset  . Heart disease Mother   . Cancer Father     Social History Social History   Tobacco Use  . Smoking status: Never Smoker  . Smokeless tobacco: Never Used  Substance Use Topics  . Alcohol use: No  . Drug use: No     Allergies   Patient has no known allergies.   Review of Systems Review of Systems  Constitutional: Positive for fever.  HENT:  Negative for congestion.   Eyes: Negative for visual disturbance.  Respiratory: Positive for cough and shortness of breath.   Cardiovascular: Positive for chest pain and leg swelling (RLE, (present for months)).  Gastrointestinal: Positive for abdominal pain (RUQ). Negative for constipation, diarrhea, nausea and vomiting.  Genitourinary: Negative for dysuria, frequency and hematuria.  Musculoskeletal: Negative for back pain and neck pain.  Skin: Positive for wound (bilat knees).  Neurological: Positive for weakness (generalized). Negative for dizziness, light-headedness, numbness and headaches.   Physical Exam Updated Vital Signs BP 120/75   Pulse 75   Temp 98.2 F (36.8 C) (Oral)   Resp (!) 24   Ht 5\' 8"  (1.727 m)   Wt 79.4 kg   SpO2 97%   BMI 26.61 kg/m   Physical Exam  Constitutional: He appears well-developed and well-nourished.  HENT:  Head: Normocephalic and atraumatic.  Mouth/Throat: Oropharynx is clear and moist.  Eyes: Conjunctivae are normal.  Neck: Neck supple.  Cardiovascular: Regular rhythm.  No murmur heard. In afib on monitor. Irregularly irregular. DP pulses weak, but able to be doppler'ed.  Pulmonary/Chest: No respiratory distress.  Tachypneic, Crackles to RLL, wet cough on exam  Abdominal: Soft. Bowel sounds are normal. He exhibits no distension. There is no tenderness. There is no guarding.  Ecchymosis to right lower ribs and RUQ with mild TTP to the RUQ. Remainder of abd exam without TTP. No rigidity, rebound or guarding.  Musculoskeletal:  RLE with 1+ edema (baseline per daughter).  Neurological: He is alert.  Answering questions appropriately. Able to give a coherent history, cranial nerves II-XII intact. 4/5 strength throughout, but symmetric. Normal sensation bilat (peripheral neuropathy at baseline)  Skin: Skin is warm. Capillary refill takes 2 to 3 seconds. He is diaphoretic.  Psychiatric: He has a normal mood and affect.  Nursing note and vitals  reviewed.   ED Treatments / Results  Labs (all labs ordered are listed, but only abnormal results are displayed) Labs Reviewed  COMPREHENSIVE METABOLIC PANEL - Abnormal; Notable for the following components:      Result Value   Sodium 130 (*)    CO2 20 (*)    Glucose, Bld 222 (*)    BUN 52 (*)    Creatinine, Ser 1.41 (*)    Calcium 7.9 (*)    Total Protein 5.7 (*)    Albumin 2.5 (*)    GFR calc non Af Amer 41 (*)    GFR calc Af Amer 48 (*)    All other components within normal limits  CBC WITH DIFFERENTIAL/PLATELET - Abnormal; Notable for the following components:   WBC 17.9 (*)    RBC 3.55 (*)    Hemoglobin 11.7 (*)    HCT 36.2 (*)  MCV 102.0 (*)    Neutro Abs 15.6 (*)    Lymphs Abs 0.4 (*)    Monocytes Absolute 1.7 (*)    Abs Immature Granulocytes 0.17 (*)    All other components within normal limits  URINALYSIS, ROUTINE W REFLEX MICROSCOPIC - Abnormal; Notable for the following components:   Color, Urine AMBER (*)    APPearance HAZY (*)    Glucose, UA 50 (*)    Protein, ur 30 (*)    All other components within normal limits  I-STAT TROPONIN, ED - Abnormal; Notable for the following components:   Troponin i, poc 0.67 (*)    All other components within normal limits  CULTURE, BLOOD (ROUTINE X 2)  CULTURE, BLOOD (ROUTINE X 2)  URINE CULTURE  CK  I-STAT CG4 LACTIC ACID, ED  I-STAT CG4 LACTIC ACID, ED  I-STAT TROPONIN, ED    EKG EKG Interpretation  Date/Time:  Sunday October 25 2018 17:50:36 EST Ventricular Rate:  94 PR Interval:    QRS Duration: 100 QT Interval:  418 QTC Calculation: 523 R Axis:   -50 Text Interpretation:  Atrial fibrillation Incomplete RBBB and LAFB Consider right ventricular hypertrophy Anteroseptal infarct, old Abnrm T, consider ischemia, anterolateral lds Prolonged QT interval afib appears new compared to 2011 Confirmed by Sherwood Gambler [phone]) on 10/25/2018 6:52:40 PM   Radiology Dg Ribs Unilateral W/chest Right  Result Date:  10/25/2018 CLINICAL DATA:  Fall 2 days ago.  Pain to lower ribs. EXAM: RIGHT RIBS AND CHEST - 3+ VIEW COMPARISON:  Chest x-ray October 25, 2014 FINDINGS: Elevation right hemidiaphragm persists. The heart, hila, and mediastinum are unchanged. Mild opacity in the bases, right greater than left. No overt edema. No other acute abnormalities. Apparent lateral right rib fracture at the site of pain. IMPRESSION: Right-sided rib fracture without pneumothorax. Right greater than left bibasilar opacities favored represent atelectasis rather than infiltrate. Electronically Signed   By: Dorise Bullion III M.D   On: 10/25/2018 19:48    Procedures Procedures (including critical care time) CRITICAL CARE Performed by: Rodney Booze   Total critical care time: 38 minutes  Critical care time was exclusive of separately billable procedures and treating other patients.  Critical care was necessary to treat or prevent imminent or life-threatening deterioration.  Critical care was time spent personally by me on the following activities: development of treatment plan with patient and/or surrogate as well as nursing, discussions with consultants, evaluation of patient's response to treatment, examination of patient, obtaining history from patient or surrogate, ordering and performing treatments and interventions, ordering and review of laboratory studies, ordering and review of radiographic studies, pulse oximetry and re-evaluation of patient's condition.   Medications Ordered in ED Medications  azithromycin (ZITHROMAX) 500 mg in sodium chloride 0.9 % 250 mL IVPB (has no administration in time range)  0.9 %  sodium chloride infusion (500 mLs Intravenous New Bag/Given 10/25/18 2131)  sodium chloride 0.9 % bolus 1,000 mL (0 mLs Intravenous Stopped 10/25/18 2057)  aspirin chewable tablet 324 mg (324 mg Oral Given 10/25/18 2125)  cefTRIAXone (ROCEPHIN) 1 g in sodium chloride 0.9 % 100 mL IVPB (1 g Intravenous New  Bag/Given 10/25/18 2132)     Initial Impression / Assessment and Plan / ED Course  I have reviewed the triage vital signs and the nursing notes.  Pertinent labs & imaging results that were available during my care of the patient were reviewed by me and considered in my medical decision making (see chart for details).  Discussed pt presentation and exam findings with Dr. Regenia Skeeter, who personally evaluated the pt and agrees with the plan to give IV abx and admit for suspected pneumonia after .  9:21 PM Called lab and they state that they do not know where the CMP is. They will attempt to locate the lab and call me back if it needs to be redrawn.   9:31 PM called lab and they state that the CMP has been located and it is currently running. Results should be available soon.   Final Clinical Impressions(s) / ED Diagnoses   Final diagnoses:  NSTEMI (non-ST elevated myocardial infarction) ([place])  Closed fracture of one rib of right side, initial encounter  Community acquired pneumonia, unspecified laterality  AKI (acute kidney injury) ([place])   Patient presenting with cough, right rib pain, generalized weakness and fevers.  EMS found the patient to be borderline febrile prior to arrival with oral temp of 100F They administered Tylenol.  On arrival here temp 99.3, heart rate elevated at 96, tachypneic with respirations at 25 and O2 sats low at 92% on room air.  Based on vital signs, patient meets sepsis criteria and therefore code sepsis was initiated.  CBC with WBC of 17. Mild anemia as well which is new. CMP with elevated BUN/Creatinine, liver enzymes WNL. Hyponatremic.  Lactic acid WNL x2. CK is WNL. Elevated troponin at 0.67 UA negative.   ECG with afib. Incomplete RBBB and LAFB. Consider right ventricular hypertrophy. Anteroseptal infarct, old. Abnrm T, consider ischemia, anterolateral lds Prolonged QT interval afib appears new compared to 2011.  CXR with right rib fx without  pneumothorax. Right greater than left bibasilar opacities favored represent atelectasis rather than infiltrate. Clinically I suspect the patient does have pnuemonia given his cough and recent h/o rib fracture. Ceftriaxone and azithromycin given to tx CAP. Case discussed with pharmacy who agrees this is appropriate despite EKG with slightly prolonged QTC.  Will plan for admission for further tx of pts CAP, new onset afib with elevated trop, and aki.  Consult with hospitalist service who accepts the patient for admission.   ED Discharge Orders    None       Bishop Dublin 10/26/18 2216    Sherwood Gambler, MD 10/30/18 [phone]

## 2018-10-25 NOTE — ED Notes (Signed)
I Stat Trop I results of 0.67 reported to Dr. Regenia Skeeter

## 2018-10-26 ENCOUNTER — Encounter (HOSPITAL_COMMUNITY): Payer: Self-pay | Admitting: Student

## 2018-10-26 ENCOUNTER — Observation Stay (HOSPITAL_COMMUNITY): Payer: Medicare Other

## 2018-10-26 DIAGNOSIS — D539 Nutritional anemia, unspecified: Secondary | ICD-10-CM | POA: Diagnosis present

## 2018-10-26 DIAGNOSIS — I10 Essential (primary) hypertension: Secondary | ICD-10-CM | POA: Diagnosis not present

## 2018-10-26 DIAGNOSIS — Z7982 Long term (current) use of aspirin: Secondary | ICD-10-CM | POA: Diagnosis not present

## 2018-10-26 DIAGNOSIS — S299XXA Unspecified injury of thorax, initial encounter: Secondary | ICD-10-CM | POA: Diagnosis not present

## 2018-10-26 DIAGNOSIS — I110 Hypertensive heart disease with heart failure: Secondary | ICD-10-CM | POA: Diagnosis present

## 2018-10-26 DIAGNOSIS — J9601 Acute respiratory failure with hypoxia: Secondary | ICD-10-CM | POA: Insufficient documentation

## 2018-10-26 DIAGNOSIS — H5462 Unqualified visual loss, left eye, normal vision right eye: Secondary | ICD-10-CM | POA: Diagnosis present

## 2018-10-26 DIAGNOSIS — I5021 Acute systolic (congestive) heart failure: Secondary | ICD-10-CM | POA: Diagnosis present

## 2018-10-26 DIAGNOSIS — R7989 Other specified abnormal findings of blood chemistry: Secondary | ICD-10-CM | POA: Diagnosis not present

## 2018-10-26 DIAGNOSIS — R931 Abnormal findings on diagnostic imaging of heart and coronary circulation: Secondary | ICD-10-CM | POA: Diagnosis not present

## 2018-10-26 DIAGNOSIS — N400 Enlarged prostate without lower urinary tract symptoms: Secondary | ICD-10-CM | POA: Diagnosis present

## 2018-10-26 DIAGNOSIS — S3991XA Unspecified injury of abdomen, initial encounter: Secondary | ICD-10-CM | POA: Diagnosis not present

## 2018-10-26 DIAGNOSIS — I429 Cardiomyopathy, unspecified: Secondary | ICD-10-CM | POA: Diagnosis present

## 2018-10-26 DIAGNOSIS — S2231XA Fracture of one rib, right side, initial encounter for closed fracture: Secondary | ICD-10-CM | POA: Diagnosis not present

## 2018-10-26 DIAGNOSIS — J189 Pneumonia, unspecified organism: Secondary | ICD-10-CM | POA: Diagnosis present

## 2018-10-26 DIAGNOSIS — Y92002 Bathroom of unspecified non-institutional (private) residence single-family (private) house as the place of occurrence of the external cause: Secondary | ICD-10-CM | POA: Diagnosis not present

## 2018-10-26 DIAGNOSIS — I452 Bifascicular block: Secondary | ICD-10-CM | POA: Diagnosis present

## 2018-10-26 DIAGNOSIS — W010XXA Fall on same level from slipping, tripping and stumbling without subsequent striking against object, initial encounter: Secondary | ICD-10-CM | POA: Diagnosis present

## 2018-10-26 DIAGNOSIS — I252 Old myocardial infarction: Secondary | ICD-10-CM | POA: Diagnosis not present

## 2018-10-26 DIAGNOSIS — E0590 Thyrotoxicosis, unspecified without thyrotoxic crisis or storm: Secondary | ICD-10-CM | POA: Diagnosis present

## 2018-10-26 DIAGNOSIS — I361 Nonrheumatic tricuspid (valve) insufficiency: Secondary | ICD-10-CM | POA: Diagnosis not present

## 2018-10-26 DIAGNOSIS — H409 Unspecified glaucoma: Secondary | ICD-10-CM | POA: Diagnosis present

## 2018-10-26 DIAGNOSIS — W19XXXA Unspecified fall, initial encounter: Secondary | ICD-10-CM | POA: Diagnosis not present

## 2018-10-26 DIAGNOSIS — M2550 Pain in unspecified joint: Secondary | ICD-10-CM | POA: Diagnosis not present

## 2018-10-26 DIAGNOSIS — I4891 Unspecified atrial fibrillation: Secondary | ICD-10-CM | POA: Diagnosis present

## 2018-10-26 DIAGNOSIS — E119 Type 2 diabetes mellitus without complications: Secondary | ICD-10-CM | POA: Diagnosis present

## 2018-10-26 DIAGNOSIS — Z79899 Other long term (current) drug therapy: Secondary | ICD-10-CM | POA: Diagnosis not present

## 2018-10-26 DIAGNOSIS — E871 Hypo-osmolality and hyponatremia: Secondary | ICD-10-CM | POA: Diagnosis not present

## 2018-10-26 DIAGNOSIS — N179 Acute kidney failure, unspecified: Secondary | ICD-10-CM | POA: Diagnosis not present

## 2018-10-26 DIAGNOSIS — Z66 Do not resuscitate: Secondary | ICD-10-CM | POA: Diagnosis present

## 2018-10-26 DIAGNOSIS — Z7984 Long term (current) use of oral hypoglycemic drugs: Secondary | ICD-10-CM | POA: Diagnosis not present

## 2018-10-26 DIAGNOSIS — J181 Lobar pneumonia, unspecified organism: Secondary | ICD-10-CM | POA: Diagnosis not present

## 2018-10-26 DIAGNOSIS — I214 Non-ST elevation (NSTEMI) myocardial infarction: Secondary | ICD-10-CM | POA: Diagnosis present

## 2018-10-26 DIAGNOSIS — E785 Hyperlipidemia, unspecified: Secondary | ICD-10-CM | POA: Diagnosis present

## 2018-10-26 DIAGNOSIS — Z8546 Personal history of malignant neoplasm of prostate: Secondary | ICD-10-CM | POA: Diagnosis not present

## 2018-10-26 DIAGNOSIS — Z7401 Bed confinement status: Secondary | ICD-10-CM | POA: Diagnosis not present

## 2018-10-26 DIAGNOSIS — J9811 Atelectasis: Secondary | ICD-10-CM | POA: Diagnosis present

## 2018-10-26 LAB — CBC WITH DIFFERENTIAL/PLATELET
Abs Immature Granulocytes: 0.16 10*3/uL — ABNORMAL HIGH (ref 0.00–0.07)
Basophils Absolute: 0 10*3/uL (ref 0.0–0.1)
Basophils Relative: 0 %
EOS ABS: 0 10*3/uL (ref 0.0–0.5)
Eosinophils Relative: 0 %
HEMATOCRIT: 33.4 % — AB (ref 39.0–52.0)
Hemoglobin: 11 g/dL — ABNORMAL LOW (ref 13.0–17.0)
Immature Granulocytes: 1 %
LYMPHS ABS: 0.6 10*3/uL — AB (ref 0.7–4.0)
LYMPHS PCT: 4 %
MCH: 33.6 pg (ref 26.0–34.0)
MCHC: 32.9 g/dL (ref 30.0–36.0)
MCV: 102.1 fL — AB (ref 80.0–100.0)
MONO ABS: 1.2 10*3/uL — AB (ref 0.1–1.0)
MONOS PCT: 8 %
NEUTROS ABS: 14 10*3/uL — AB (ref 1.7–7.7)
NEUTROS PCT: 87 %
PLATELETS: 196 10*3/uL (ref 150–400)
RBC: 3.27 MIL/uL — ABNORMAL LOW (ref 4.22–5.81)
RDW: 13 % (ref 11.5–15.5)
WBC: 16.1 10*3/uL — ABNORMAL HIGH (ref 4.0–10.5)
nRBC: 0 % (ref 0.0–0.2)

## 2018-10-26 LAB — HEPATIC FUNCTION PANEL
ALK PHOS: 58 U/L (ref 38–126)
ALT: 27 U/L (ref 0–44)
AST: 37 U/L (ref 15–41)
Albumin: 2.3 g/dL — ABNORMAL LOW (ref 3.5–5.0)
Bilirubin, Direct: 0.3 mg/dL — ABNORMAL HIGH (ref 0.0–0.2)
Indirect Bilirubin: 0.6 mg/dL (ref 0.3–0.9)
TOTAL PROTEIN: 5.5 g/dL — AB (ref 6.5–8.1)
Total Bilirubin: 0.9 mg/dL (ref 0.3–1.2)

## 2018-10-26 LAB — BASIC METABOLIC PANEL
ANION GAP: 5 (ref 5–15)
BUN: 55 mg/dL — ABNORMAL HIGH (ref 8–23)
CHLORIDE: 105 mmol/L (ref 98–111)
CO2: 22 mmol/L (ref 22–32)
Calcium: 7.4 mg/dL — ABNORMAL LOW (ref 8.9–10.3)
Creatinine, Ser: 1.25 mg/dL — ABNORMAL HIGH (ref 0.61–1.24)
GFR calc Af Amer: 55 mL/min — ABNORMAL LOW (ref >60)
GFR, EST NON AFRICAN AMERICAN: 48 mL/min — AB (ref >60)
Glucose, Bld: 146 mg/dL — ABNORMAL HIGH (ref 70–99)
POTASSIUM: 3.7 mmol/L (ref 3.5–5.1)
Sodium: 132 mmol/L — ABNORMAL LOW (ref 135–145)

## 2018-10-26 LAB — GLUCOSE, CAPILLARY
GLUCOSE-CAPILLARY: 169 mg/dL — AB (ref 70–99)
GLUCOSE-CAPILLARY: 167 mg/dL — AB (ref 70–99)
Glucose-Capillary: 144 mg/dL — ABNORMAL HIGH (ref 70–99)
Glucose-Capillary: 176 mg/dL — ABNORMAL HIGH (ref 70–99)

## 2018-10-26 LAB — HEMOGLOBIN A1C
Hgb A1c MFr Bld: 6.2 % — ABNORMAL HIGH (ref 4.8–5.6)
MEAN PLASMA GLUCOSE: 131.24 mg/dL

## 2018-10-26 LAB — TSH: TSH: 0.076 u[IU]/mL — ABNORMAL LOW (ref 0.350–4.500)

## 2018-10-26 LAB — INFLUENZA PANEL BY PCR (TYPE A & B)
INFLBPCR: NEGATIVE
Influenza A By PCR: NEGATIVE

## 2018-10-26 LAB — T4, FREE: FREE T4: 1.38 ng/dL (ref 0.82–1.77)

## 2018-10-26 LAB — TROPONIN I
TROPONIN I: 0.34 ng/mL — AB (ref <0.03)
Troponin I: 0.48 ng/mL (ref <0.03)
Troponin I: 0.38 ng/mL (ref <0.03)

## 2018-10-26 LAB — ECHOCARDIOGRAM COMPLETE
HEIGHTINCHES: 68 in
Weight: 3019.42 oz

## 2018-10-26 LAB — STREP PNEUMONIAE URINARY ANTIGEN: Strep Pneumo Urinary Antigen: NEGATIVE

## 2018-10-26 LAB — MAGNESIUM: MAGNESIUM: 1.8 mg/dL (ref 1.7–2.4)

## 2018-10-26 MED ORDER — ALBUTEROL SULFATE (2.5 MG/3ML) 0.083% IN NEBU
2.5000 mg | INHALATION_SOLUTION | Freq: Four times a day (QID) | RESPIRATORY_TRACT | Status: DC | PRN
  Administered 2018-10-26: 2.5 mg via RESPIRATORY_TRACT
  Filled 2018-10-26: qty 3

## 2018-10-26 MED ORDER — PERFLUTREN LIPID MICROSPHERE
1.0000 mL | INTRAVENOUS | Status: AC | PRN
  Administered 2018-10-26: 3 mL via INTRAVENOUS
  Filled 2018-10-26: qty 10

## 2018-10-26 MED ORDER — ALBUTEROL SULFATE (2.5 MG/3ML) 0.083% IN NEBU
2.5000 mg | INHALATION_SOLUTION | Freq: Four times a day (QID) | RESPIRATORY_TRACT | Status: AC
  Administered 2018-10-26 (×2): 2.5 mg via RESPIRATORY_TRACT
  Filled 2018-10-26 (×2): qty 3

## 2018-10-26 MED ORDER — SODIUM CHLORIDE 0.9 % IV SOLN
INTRAVENOUS | Status: DC | PRN
  Administered 2018-10-26 – 2018-10-27 (×3): 1000 mL via INTRAVENOUS

## 2018-10-26 MED ORDER — PERFLUTREN LIPID MICROSPHERE
INTRAVENOUS | Status: AC
  Administered 2018-10-26: 3 mL via INTRAVENOUS
  Filled 2018-10-26: qty 10

## 2018-10-26 MED ORDER — SODIUM CHLORIDE 0.9 % IV SOLN
500.0000 mg | INTRAVENOUS | Status: DC
  Administered 2018-10-26 – 2018-10-27 (×2): 500 mg via INTRAVENOUS
  Filled 2018-10-26 (×2): qty 500

## 2018-10-26 MED ORDER — APIXABAN 5 MG PO TABS
5.0000 mg | ORAL_TABLET | Freq: Two times a day (BID) | ORAL | Status: DC
  Administered 2018-10-26 – 2018-10-28 (×4): 5 mg via ORAL
  Filled 2018-10-26 (×4): qty 1

## 2018-10-26 MED ORDER — ACETAMINOPHEN 325 MG PO TABS: 650.0000 mg | ORAL_TABLET | Freq: Four times a day (QID) | ORAL | Status: DC | PRN

## 2018-10-26 MED ORDER — INSULIN ASPART 100 UNIT/ML ~~LOC~~ SOLN
0.0000 [IU] | Freq: Three times a day (TID) | SUBCUTANEOUS | Status: DC
  Administered 2018-10-26: 2 [IU] via SUBCUTANEOUS
  Administered 2018-10-26: 1 [IU] via SUBCUTANEOUS
  Administered 2018-10-26 – 2018-10-28 (×5): 2 [IU] via SUBCUTANEOUS

## 2018-10-26 MED ORDER — SODIUM CHLORIDE 0.9 % IV SOLN
1.0000 g | INTRAVENOUS | Status: DC
  Administered 2018-10-26 – 2018-10-27 (×2): 1 g via INTRAVENOUS
  Filled 2018-10-26 (×3): qty 10

## 2018-10-26 NOTE — H&P (Signed)
History and Physical    ATA PECHA EXB:[id number] DOB: 1922/09/29 DOA: 10/25/2018  Referring MD/NP/PA:  PCP: Deland Pretty, MD  Patient coming from: home  Chief Complaint: fever and cough  HPI: Geoffrey Bennett is a 84 y.o. male with medical history significant of diabetes mellitus type 2, hypertension, hyperlipidemia, left eye blind, prostate cancer was brought to the ER after patient was found to have fever and cough.  Patient had a fall at home 2 days prior in his bathroom after he slipped but did not hit his head but he fell in between the commode in the bathtub and was lost the first 6 hours.  Patient's daughter called EMS when she was not able to reach patient through the phone.  Patient was eased out of the position by the EMS.  Since then patient has been having some pain around the ribs.  He developed fever of 34 F with some productive cough which has been persistent.  Patient also was complaining of pain in the right rib area on coughing.  Patient has been having multiple bruises in the abdomen knee.  Patient denies losing consciousness or hitting his head during the fall ED Course:  In the ER x-rays revealed right rib fracture.  Exact numbers not noted.  Bilateral atelectasis but patient on exam has productive cough and appears wet.  EKG shows atrial fibrillation which is new onset.  Also there is EKG finding concerning for lateral ischemia is confirmed with after discussing with Dr. Ellyn Hack on-call cardiologist by me.  Troponin is mildly elevated.  Patient has leukocytosis.  Cultures were sent and started on empiric antibiotics for pneumonia.  Review of Systems: As per HPI otherwise 10 point review of systems negative.   Past Medical History:  Diagnosis Date  . CAP (community acquired pneumonia)   . Cataract   . Diabetes mellitus without complication ([place])   . Fracture of one rib, unsp side, init for clos fx   . Glaucoma   . Hypertension   . NSTEMI (non-ST elevated myocardial  infarction) ([place])   . prostate ca dx'd 2008   seed implant  . Retinal detachment    of left eye with laser surgery    Past Surgical History:  Procedure Laterality Date  . EYE SURGERY    . HERNIA REPAIR    . JOINT REPLACEMENT    . PROSTATE SURGERY       reports that he has never smoked. He has never used smokeless tobacco. He reports that he does not drink alcohol or use drugs.  No Known Allergies  Family History  Problem Relation Age of Onset  . Heart disease Mother   . Cancer Father   . Heart disease Father      Prior to Admission medications   Medication Sig Start Date End Date Taking? Authorizing Provider  atorvastatin (LIPITOR) 20 MG tablet Take 20 mg by mouth at bedtime.    Yes [provider]  brimonidine (ALPHAGAN P) 0.1 % SOLN Place 1 drop into the left eye at bedtime.   Yes [provider]  losartan (COZAAR) 50 MG tablet Take 50 mg by mouth at bedtime.  06/19/13  Yes [provider]  metFORMIN (GLUCOPHAGE-XR) 500 MG 24 hr tablet Take 1,000 mg by mouth at bedtime. 10/07/18  Yes [provider]  Polyvinyl Alcohol-Povidone (REFRESH OP) Place 1 drop into both eyes at bedtime.   Yes [provider]  PRESCRIPTION MEDICATION Injection to lower testosterone levels - administered  by Dr. Diona Fanti [facility] once every 4 months. Last injection October 2019   Yes [provider]  timolol (TIMOPTIC) 0.5 % ophthalmic solution Place 1 drop into the left eye at bedtime.  06/18/13  Yes [provider]    Physical Exam: Vitals:   10/25/18 2057 10/25/18 2306 10/26/18 0454 10/26/18 1333  BP:  (!) 102/49 (!) 100/59 (!) 119/56  Pulse: 75 71 77 75  Resp: (!) 24 20 20    Temp: 98.2 F (36.8 C) 98.2 F (36.8 C) 97.8 F (36.6 C) 98.8 F (37.1 C)  TempSrc: Oral Oral Oral Oral  SpO2: 97% 92% 93% 92%  Weight:  85 kg 85.6 kg   Height:  5\' 8"  (1.727 m)        Constitutional: NAD, calm, comfortable Vitals:    10/25/18 2057 10/25/18 2306 10/26/18 0454 10/26/18 1333  BP:  (!) 102/49 (!) 100/59 (!) 119/56  Pulse: 75 71 77 75  Resp: (!) 24 20 20    Temp: 98.2 F (36.8 C) 98.2 F (36.8 C) 97.8 F (36.6 C) 98.8 F (37.1 C)  TempSrc: Oral Oral Oral Oral  SpO2: 97% 92% 93% 92%  Weight:  85 kg 85.6 kg   Height:  5\' 8"  (1.727 m)     Eyes: PERRL, lids and conjunctivae normal ENMT: Mucous membranes are moist. Posterior pharynx clear of any exudate or lesions.Normal dentition.  Neck: normal, supple, no masses, no thyromegaly Respiratory: respirations shallow. Decreased air movement on right fair air movement on left no crackles rhonchi or wheezes  Cardiovascular: irregularly irregular no murmurs / rubs / gallops. No extremity edema. 2+ pedal pulses. No carotid bruits.  Abdomen: no tenderness, no masses palpated. No hepatosplenomegaly. Bowel sounds positive.  Musculoskeletal: no clubbing / cyanosis. No joint deformity upper and lower extremities. Good ROM, no contractures. Normal muscle tone.  Skin: no rashes, lesions, ulcers. No induration Neurologic: CN 2-12 grossly intact. Sensation intact, DTR normal. Strength 5/5 in all 4.  Psychiatric: Normal judgment and insight. Alert and oriented x 3. Normal mood.     Labs on Admission: I have personally reviewed following labs and imaging studies  CBC: Recent Labs  Lab 10/25/18 1920 10/26/18 0620  WBC 17.9* 16.1*  NEUTROABS 15.6* 14.0*  HGB 11.7* 11.0*  HCT 36.2* 33.4*  MCV 102.0* 102.1*  PLT 202 704   Basic Metabolic Panel: Recent Labs  Lab 10/25/18 1920 10/25/18 2357 10/26/18 0620  NA 130*  --  132*  K 3.5  --  3.7  CL 99  --  105  CO2 20*  --  22  GLUCOSE 222*  --  146*  BUN 52*  --  55*  CREATININE 1.41*  --  1.25*  CALCIUM 7.9*  --  7.4*  MG  --  1.8  --    GFR: Estimated Creatinine Clearance: 39.3 mL/min (A) (by C-G formula based on SCr of 1.25 mg/dL (H)). Liver Function Tests: Recent Labs  Lab 10/25/18 1920 10/26/18 0620    AST 33 37  ALT 26 27  ALKPHOS 59 58  BILITOT 1.0 0.9  PROT 5.7* 5.5*  ALBUMIN 2.5* 2.3*   No results for input(s): LIPASE, AMYLASE in the last 168 hours. No results for input(s): AMMONIA in the last 168 hours. Coagulation Profile: No results for input(s): INR, PROTIME in the last 168 hours. Cardiac Enzymes: Recent Labs  Lab 10/25/18 1910 10/25/18 2357 10/26/18 0620 10/26/18 1125  CKTOTAL 200  --   --   --  TROPONINI  --  0.48* 0.38* 0.34*   BNP (last 3 results) No results for input(s): PROBNP in the last 8760 hours. HbA1C: No results for input(s): HGBA1C in the last 72 hours. CBG: Recent Labs  Lab 10/26/18 0733 10/26/18 1122  GLUCAP 144* 169*   Lipid Profile: No results for input(s): CHOL, HDL, LDLCALC, TRIG, CHOLHDL, LDLDIRECT in the last 72 hours. Thyroid Function Tests: Recent Labs    10/25/18 2357  TSH 0.076*   Anemia Panel: No results for input(s): VITAMINB12, FOLATE, FERRITIN, TIBC, IRON, RETICCTPCT in the last 72 hours. Urine analysis:    Component Value Date/Time   COLORURINE AMBER (A) 10/25/2018 2112   APPEARANCEUR HAZY (A) 10/25/2018 2112   LABSPEC 1.018 10/25/2018 2112   PHURINE 5.0 10/25/2018 2112   GLUCOSEU 50 (A) 10/25/2018 2112   HGBUR NEGATIVE 10/25/2018 2112   BILIRUBINUR NEGATIVE 10/25/2018 2112   BILIRUBINUR negative 03/26/2017 0847   BILIRUBINUR neg 09/09/2014 [place] 10/25/2018 2112   PROTEINUR 30 (A) 10/25/2018 2112   UROBILINOGEN 0.2 03/26/2017 0847   UROBILINOGEN 0.2 04/15/2010 0013   NITRITE NEGATIVE 10/25/2018 2112   LEUKOCYTESUR NEGATIVE 10/25/2018 2112   Sepsis Labs: @LABRCNTIP (procalcitonin:4,lacticidven:4) )No results found for this or any previous visit (from the past 240 hour(s)).   Radiological Exams on Admission: Ct Abdomen Pelvis Wo Contrast  Result Date: 10/26/2018 CLINICAL DATA:  Fall EXAM: CT CHEST, ABDOMEN AND PELVIS WITHOUT CONTRAST TECHNIQUE: Multidetector CT imaging of the chest, abdomen  and pelvis was performed following the standard protocol without IV contrast. COMPARISON:  CT chest abdomen pelvis 10/09/2018 FINDINGS: CT CHEST FINDINGS Cardiovascular: Heart size is normal without pericardial effusion. The thoracic aorta is normal in course and caliber. There is moderate calcific coronary artery and aortic atherosclerosis. Mediastinum/Nodes: No mediastinal hematoma. No mediastinal, hilar or axillary lymphadenopathy. The visualized thyroid and thoracic esophageal course are unremarkable. Lungs/Pleura: Spiculated lesion in the right upper lobe, decreased from the prior study. There is a right upper lobe nodule that measures 11 mm, possibly new from the prior study. There is now consolidation of the right middle and lower lobes. Left upper lobe nodule has decreased in size. Nodule in the left lower lobe has substantially resolved. No left pleural effusion. Musculoskeletal: There are mildly displaced fractures of the right fifth through seventh ribs. CT ABDOMEN PELVIS FINDINGS Hepatobiliary: No hepatic hematoma or laceration. No biliary dilatation. Cholelithiasis without acute inflammation. Pancreas: Normal contours without ductal dilatation. No peripancreatic fluid collection. Spleen: No splenic laceration or hematoma. Adrenals/Urinary Tract: --Adrenal glands: No adrenal hemorrhage. --Right kidney/ureter: No hydronephrosis or perinephric hematoma. --Left kidney/ureter: Upper pole cyst measures 3.6 cm. --Urinary bladder: Unremarkable. Stomach/Bowel: --Stomach/Duodenum: No hiatal hernia or other gastric abnormality. Normal duodenal course and caliber. --Small bowel: No dilatation or inflammation. --Colon: Rectosigmoid diverticulosis without acute inflammation. --Appendix: Normal. Vascular/Lymphatic: Atherosclerotic calcification is present within the non-aneurysmal abdominal aorta, without hemodynamically significant stenosis. No abdominal or pelvic lymphadenopathy. Reproductive: Brachytherapy seeds  within the prostate gland. Musculoskeletal. Bilateral sacroiliac ankylosis. Other: None. IMPRESSION: 1. Mildly displaced fractures of the right fifth through seventh ribs. 2. New consolidation of the right middle and lower lobes. Etiology is uncertain. This is more posterior than the rib fractures and more opaque than typical contusion. It is more likely infectious or neoplastic. 3. Generally decreased size of multiple nodular opacities throughout the lungs. There is a single nodule in the right upper lobe that appears to have increased in size, though this may have been part of 1 of the  more confluent nodular areas on the prior study. 4. Coronary artery and aortic atherosclerosis (ICD10-I70.0). Electronically Signed   By: Ulyses Jarred M.D.   On: 10/26/2018 01:04   Dg Ribs Unilateral W/chest Right  Result Date: 10/25/2018 CLINICAL DATA:  Fall 2 days ago.  Pain to lower ribs. EXAM: RIGHT RIBS AND CHEST - 3+ VIEW COMPARISON:  Chest x-ray October 25, 2014 FINDINGS: Elevation right hemidiaphragm persists. The heart, hila, and mediastinum are unchanged. Mild opacity in the bases, right greater than left. No overt edema. No other acute abnormalities. Apparent lateral right rib fracture at the site of pain. IMPRESSION: Right-sided rib fracture without pneumothorax. Right greater than left bibasilar opacities favored represent atelectasis rather than infiltrate. Electronically Signed   By: Dorise Bullion III M.D   On: 10/25/2018 19:48   Ct Chest Wo Contrast  Result Date: 10/26/2018 CLINICAL DATA:  Fall EXAM: CT CHEST, ABDOMEN AND PELVIS WITHOUT CONTRAST TECHNIQUE: Multidetector CT imaging of the chest, abdomen and pelvis was performed following the standard protocol without IV contrast. COMPARISON:  CT chest abdomen pelvis 10/09/2018 FINDINGS: CT CHEST FINDINGS Cardiovascular: Heart size is normal without pericardial effusion. The thoracic aorta is normal in course and caliber. There is moderate calcific coronary  artery and aortic atherosclerosis. Mediastinum/Nodes: No mediastinal hematoma. No mediastinal, hilar or axillary lymphadenopathy. The visualized thyroid and thoracic esophageal course are unremarkable. Lungs/Pleura: Spiculated lesion in the right upper lobe, decreased from the prior study. There is a right upper lobe nodule that measures 11 mm, possibly new from the prior study. There is now consolidation of the right middle and lower lobes. Left upper lobe nodule has decreased in size. Nodule in the left lower lobe has substantially resolved. No left pleural effusion. Musculoskeletal: There are mildly displaced fractures of the right fifth through seventh ribs. CT ABDOMEN PELVIS FINDINGS Hepatobiliary: No hepatic hematoma or laceration. No biliary dilatation. Cholelithiasis without acute inflammation. Pancreas: Normal contours without ductal dilatation. No peripancreatic fluid collection. Spleen: No splenic laceration or hematoma. Adrenals/Urinary Tract: --Adrenal glands: No adrenal hemorrhage. --Right kidney/ureter: No hydronephrosis or perinephric hematoma. --Left kidney/ureter: Upper pole cyst measures 3.6 cm. --Urinary bladder: Unremarkable. Stomach/Bowel: --Stomach/Duodenum: No hiatal hernia or other gastric abnormality. Normal duodenal course and caliber. --Small bowel: No dilatation or inflammation. --Colon: Rectosigmoid diverticulosis without acute inflammation. --Appendix: Normal. Vascular/Lymphatic: Atherosclerotic calcification is present within the non-aneurysmal abdominal aorta, without hemodynamically significant stenosis. No abdominal or pelvic lymphadenopathy. Reproductive: Brachytherapy seeds within the prostate gland. Musculoskeletal. Bilateral sacroiliac ankylosis. Other: None. IMPRESSION: 1. Mildly displaced fractures of the right fifth through seventh ribs. 2. New consolidation of the right middle and lower lobes. Etiology is uncertain. This is more posterior than the rib fractures and more  opaque than typical contusion. It is more likely infectious or neoplastic. 3. Generally decreased size of multiple nodular opacities throughout the lungs. There is a single nodule in the right upper lobe that appears to have increased in size, though this may have been part of 1 of the more confluent nodular areas on the prior study. 4. Coronary artery and aortic atherosclerosis (ICD10-I70.0). Electronically Signed   By: Ulyses Jarred M.D.   On: 10/26/2018 01:04    EKG: Independently reviewed. Atrial fibrillation Incomplete RBBB and LAFB Consider right ventricular hypertrophy Anteroseptal infarct, old Abnrm T, consider ischemia, anterolateral lds Prolonged QT interval   Assessment/Plan Principal Problem:   CAP (community acquired pneumonia) Active Problems:   NSTEMI (non-ST elevated myocardial infarction) ([facility])   Closed fracture of one rib of  right side   New onset atrial fibrillation ([facility])   DM (diabetes mellitus) ([place])   Essential hypertension   BPH (benign prostatic hyperplasia)   Multiple pulmonary nodules   AKI (acute kidney injury) ([place])   1. Community-acquired pneumonia -patient symptoms are concerning for pneumonia.  Could be community-acquired versus aspiration. Xray with infiltrate right mid and lower lobe. Afebrile non-toxic appearing.  Patient has been placed on ceftriaxone and Zithromax. Will  Follow blood cultures sputum cultures. influenza PCR negative. Will add nebs for short term 2. Non-ST elevation MI - 2D echo reveals EF 35% with anterior, anteroseptal, apical and inferoapical hypokinesis and incoordinated septal motion. Patient is on statins and aspirin.  Heart rate is in the 70s and blood pressure in the low normal.  troponin minimally elevated ant trending down. ekg with new afib. Evaluated by cards who opine large wraparound LAD with a stenosis vs stress induced cardiomyopathy. Recommending medical therapy with repeat echo 6-8 weeks 3. New onset atrial fibrillation  -patient's heart rate is presently controlled. [place] score high per cardiology related to age and new LV dysfunction. Recommending Eliquis. 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 Will start eliquis 4. Right rib fracture status post fall -this patient has significant ecchymotic areas in the abdomen. CT with mildly displaced fractures or right 5-7 ribs. Incentive spirometer.  Physical therapy consult. Mobilize pain control 5. Renal failure could be acute old labs are available in 2011. Improving. Creatinine 1.2 down from 1.4 yesterday. Continue gentlel hydrate holding ARB.  Closely follow intake output metabolic panel. 6. Macrocytic anemia. Stable. follow CBC. 7. History of prostate cancer being followed by Dr. Diona Fanti. 8. Diabetes mellitus type 2. Serum glucose 146.  we will keep patient on sliding 9. Hyperthyroid. TSH low. No noted hx of same. Will obtain free T$  DVT prophylaxis: eliquis Code Status: dnr Family Communication: daughter Disposition Plan: home Consults called: [place] Admission status: inpatient   Radene Gunning NP [facility]   If 7PM-7AM, please contact night-coverage [email or web address] Password [email or web address]  10/26/2018, 2:46 PM

## 2018-10-26 NOTE — Progress Notes (Signed)
CRITICAL VALUE ALERT  Critical Value:  TROPONIN: 0.42  Date & Time Notied:  10/26/2018, 0110  Provider Notified: [facility]  Orders Received/Actions taken: none yet, will continue to monitor

## 2018-10-26 NOTE — Evaluation (Signed)
Physical Therapy Evaluation Patient Details Name: Geoffrey Bennett MRN: [id number] DOB: Dec 18, 1922 Today's Date: 10/26/2018   History of Present Illness  84 y.o. male with history of diabetes mellitus type 2, hypertension, hyperlipidemia, left eye blind, prostate cancer was brought to the ER after patient was found to have fever and cough.  Patient had a fall at home 2 days ago in his bathroom after he slipped but did not hit his head but fell in between the commode in the bathtub. Xray revealed R rib fxs.     Clinical Impression  Pt admitted with above diagnosis. Pt currently with functional limitations due to the deficits listed below (see PT Problem List). PTA pt lived at home alone, modified independent with mobility using [facility] for ambulation. On eval, he required min assist bed mobility and transfers. Pt very fearful of ambulation due to recent fall. Pt with primary c/o R flank pain and balance deficits. Pt will benefit from skilled PT to increase their independence and safety with mobility to allow discharge to the venue listed below.       Follow Up Recommendations Home health PT;Supervision/Assistance - 24 hour    Equipment Recommendations  None recommended by PT    Recommendations for Other Services       Precautions / Restrictions Precautions Precautions: Fall Restrictions Weight Bearing Restrictions: No      Mobility  Bed Mobility Overal bed mobility: Needs Assistance Bed Mobility: Supine to Sit     Supine to sit: Min assist;HOB elevated     General bed mobility comments: +rail, increased time and effort  Transfers Overall transfer level: Needs assistance Equipment used: Rolling walker (2 wheeled) Transfers: Sit to/from [facility] Sit to Stand: Min assist Stand pivot transfers: Min assist       General transfer comment: cues for hand placement and safety, assist to power up and stabilize initial standing balance  Ambulation/Gait              General Gait Details: pt declining due to increased fear of falling  Stairs            Wheelchair Mobility    Modified Rankin (Stroke Patients Only)       Balance Overall balance assessment: Needs assistance Sitting-balance support: No upper extremity supported;Feet supported Sitting balance-Leahy Scale: Good     Standing balance support: Bilateral upper extremity supported;During functional activity Standing balance-Leahy Scale: Poor Standing balance comment: bracing BLE against recliner in stance for suppport. Reliant on RW.                              Pertinent Vitals/Pain Pain Assessment: Faces Faces Pain Scale: Hurts little more Pain Location: R flank Pain Descriptors / Indicators: Grimacing;Guarding;Sore Pain Intervention(s): Monitored during session    Home Living Family/patient expects to be discharged to:: Private residence Living Arrangements: Alone(daughter lives close by) Available Help at Discharge: Family;Available 24 hours/day Type of Home: House Home Access: Stairs to enter   [employer] of Steps: 3 Home Layout: Two level;Able to live on main level with bedroom/bathroom Home Equipment: Gilford Rile - 2 wheels;Bedside commode;Cane - single point Additional Comments: Pt has the ability to live on main level but PTA was still using his bedroom and bathroom upstairs. The upstairs bath has walk in shower with seat and grab bars. The above info is for the downstairs bath.     Prior Function Level of Independence: Independent with assistive device(s)  Comments: cane for ambulation. Drives. Utilizes fast food restaurants for most meals.     Hand Dominance        Extremity/Trunk Assessment   Upper Extremity Assessment Upper Extremity Assessment: Overall WFL for tasks assessed    Lower Extremity Assessment Lower Extremity Assessment: Overall WFL for tasks assessed    Cervical / Trunk Assessment Cervical /  Trunk Assessment: Kyphotic  Communication   Communication: HOH  Cognition Arousal/Alertness: Awake/alert Behavior During Therapy: WFL for tasks assessed/performed Overall Cognitive Status: Within Functional Limits for tasks assessed                                 General Comments: Pt presents with a mild, quiet personality.       General Comments General comments (skin integrity, edema, etc.): Daughter present during session. Reports plans to move her dad downstairs in his house and remodel bathroom.     Exercises General Exercises - Lower Extremity Ankle Circles/Pumps: AROM;Both;10 reps Long Arc Quad: AROM;Right;Left;5 reps   Assessment/Plan    PT Assessment Patient needs continued PT services  PT Problem List Decreased mobility;Decreased knowledge of precautions;Decreased activity tolerance;Pain;Decreased knowledge of use of DME;Decreased balance       PT Treatment Interventions DME instruction;Therapeutic activities;Gait training;Therapeutic exercise;Patient/family education;Stair training;Balance training;Functional mobility training    PT Goals (Current goals can be found in the Care Plan section)  Acute Rehab PT Goals Patient Stated Goal: home PT Goal Formulation: With patient/family Time For Goal Achievement: 11/09/18 Potential to Achieve Goals: Good    Frequency Min 3X/week   Barriers to discharge        Co-evaluation               AM-PAC PT "6 Clicks" Daily Activity  Outcome Measure Difficulty turning over in bed (including adjusting bedclothes, sheets and blankets)?: A Lot Difficulty moving from lying on back to sitting on the side of the bed? : A Lot Difficulty sitting down on and standing up from a chair with arms (e.g., wheelchair, bedside commode, etc,.)?: A Lot Help needed moving to and from a bed to chair (including a wheelchair)?: A Little Help needed walking in hospital room?: A Little Help needed climbing 3-5 steps with a  railing? : A Lot 6 Click Score: 14    End of Session Equipment Utilized During Treatment: Gait belt Activity Tolerance: Patient limited by fatigue;Patient limited by pain Patient left: in chair;with call bell/phone within reach;with family/visitor present Nurse Communication: Mobility status PT Visit Diagnosis: Unsteadiness on feet (R26.81);Pain;Difficulty in walking, not elsewhere classified (R26.2);History of falling (Z91.81)    Time: 6415-8309 PT Time Calculation (min) (ACUTE ONLY): 16 min   Charges:   PT Evaluation $PT Eval Moderate Complexity: 1 Mod          Lorrin Goodell, PT  Office # [phone] Pager [phone]   Lorriane Shire 10/26/2018, 12:15 PM

## 2018-10-26 NOTE — Consult Note (Addendum)
Cardiology Consult    Patient ID: Geoffrey Bennett MRN: [id number], DOB/AGE: 82/23/1923   Admit date: 10/25/2018 Date of Consult: 10/26/2018  Primary Physician: Geoffrey Pretty, MD Primary Cardiologist: Geoffrey Klein, MD  Patient Profile    Geoffrey Bennett is a 84 y.o. male with a history of hypertension, hyperlipidemia, type 2 diabetes, mild aortic insufficiency, and secondary degree AV block Mobtiz type 1, who is being seen today for the evaluation of atrial fibrillation at the request of Dr. Evangeline Bennett.  History of Present Illness    Geoffrey Bennett is a 84 year old Caucasian male with the above history who is followed by Dr. Sallyanne Bennett. He was first seen by Dr. Sallyanne Bennett in 2014 for suspected atrial fibrillation. It was felt to be sinus rhythm with very frequent PACs and episodes of accelerated atrial rhythm with very distinct P waves. Event monitor at that time showed frequent episodes of irregular rhythm related to PACs, blocked PACs, and 2nd degree AV block Mobitz type 1. He was last seen by Dr. Sallyanne Bennett on 10/03/2016 at which time he continued to have evidence of slow progression of his intraventricular and AV conduction abnormalities on his EKG but he denied any dizziness, lightheadedness, or falls since his last visit.   Patient presented to the ED yesterday via EMS for evaluation of right rib pain and fever. Patient slipped in the bathroom 2 days ago and fell onto his right side. He was wedged between the toilet and bathtub for about 6 hours before anyone was able to help him. Patient has complained of right rib pain since the fall. Patient denies any chest pain, shortness of breath, palpitations, lightheadedness/dizziness, or vision changes prior to fall. Patient was in his usual state of health prior to fall. He notes some nasal drainage, but denies any recent illness. However, per chart review, patient's daughter expressed concern yesterday in the ED that patient has had a nonproductive cough for  the past several days and had a temperature of 100.0 yesterday morning.  Upon arrival to the ED, patient was mildly tachypneic with respiratory rate of 25 with SpO2 of 92%. EKG showed atrial fibrillation, rate 92 bpm, with T wave inversion in lateral leads. I-stat troponin elevated at 0.67. Chest x-ray showed right-sided rib fracture without pneumothorax and bibasilar opacities (right > left) that favor atelectasis rather than infiltrate. WBC 17.9, Hgb 11.7, Plts 202. Na 130, K 3.5, Glucose 222, SCr 1.41. Blood cultures were sent and patient was started on empiric antibiotics for pneumonia.  Currently, patient denies any chest pain, shortness of breath, or palpitations. Patient is very active and goes to the gym 3 times per week. He denies any recent exertional symptoms.   Past Medical History   Past Medical History:  Diagnosis Date  . Cataract   . Diabetes mellitus without complication ([place])   . Glaucoma   . Hypertension   . prostate ca dx'd 2008   seed implant  . Retinal detachment    of left eye with laser surgery    Past Surgical History:  Procedure Laterality Date  . EYE SURGERY    . HERNIA REPAIR    . JOINT REPLACEMENT    . PROSTATE SURGERY       Allergies  No Known Allergies  Inpatient Medications    . aspirin EC  81 mg Oral Daily  . atorvastatin  20 mg Oral QHS  . brimonidine  1 drop Left Eye QHS  . insulin aspart  0-9 Units Subcutaneous TID  WC  . timolol  1 drop Left Eye QHS    Family History    Family History  Problem Relation Age of Onset  . Heart disease Mother   . Cancer Father   . Heart disease Father    He indicated that his mother is deceased. He indicated that his father is deceased. He indicated that his maternal grandmother is deceased. He indicated that his maternal grandfather is deceased. He indicated that his paternal grandmother is deceased. He indicated that his paternal grandfather is deceased.   Social History    Social History    Socioeconomic History  . Marital status: Widowed    Spouse name: Not on file  . Number of children: Not on file  . Years of education: Not on file  . Highest education level: Not on file  Occupational History  . Not on file  Social Needs  . Financial resource strain: Not hard at all  . Food insecurity:    Worry: Never true    Inability: Never true  . Transportation needs:    Medical: No    Non-medical: No  Tobacco Use  . Smoking status: Never Smoker  . Smokeless tobacco: Never Used  Substance and Sexual Activity  . Alcohol use: No  . Drug use: No  . Sexual activity: Never    Birth control/protection: Abstinence  Lifestyle  . Physical activity:    Days per week: 3 days    Minutes per session: 30 min  . Stress: Not at all  Relationships  . Social connections:    Talks on phone: More than three times a week    Gets together: Three times a week    Attends religious service: More than 4 times per year    Active member of club or organization: Yes    Attends meetings of clubs or organizations: 1 to 4 times per year    Relationship status: Widowed  . Intimate partner violence:    Fear of current or ex partner: No    Emotionally abused: No    Physically abused: No    Forced sexual activity: No  Other Topics Concern  . Not on file  Social History Narrative  . Not on file     Review of Systems    Review of Systems  Constitutional: Positive for fever. Negative for chills.  HENT: Positive for congestion.   Eyes: Negative for blurred vision and double vision.  Respiratory: Negative for cough, hemoptysis, sputum production and shortness of breath.   Cardiovascular: Negative for chest pain and palpitations.  Gastrointestinal: Negative for abdominal pain, blood in stool, nausea and vomiting.  Genitourinary: Negative for hematuria.  Musculoskeletal: Positive for falls.  Neurological: Positive for tingling. Negative for dizziness and loss of consciousness.   Endo/Heme/Allergies: Does not bruise/bleed easily.  Psychiatric/Behavioral: Negative for substance abuse.    Physical Exam    Blood pressure (!) 119/56, pulse 75, temperature 98.8 F (37.1 C), temperature source Oral, resp. rate 20, height 5\' 8"  (1.727 m), weight 85.6 kg, SpO2 92 %.  General: 84 y.o. male sitting comfortably in no acute distress. Pleasant and cooperative. HEENT: Normal  Neck: Supple. No carotid bruits or JVD appreciated. Lungs: No increased work of breathing. Clear to auscultation bilaterally. No wheezes, rhonchi, or rales. Heart: Slightly irregular rhythm with normal rate. Distinct S1 and S2. No murmurs, gallops, or rubs.  Abdomen: Soft, non-distended, and non-tender to palpation. Bowel sounds present.   Extremities: 1+ pitting edema of bilateral lower extremities.  Radial pulses 2+ and equal bilaterally. Neuro: Alert and oriented x3. No focal deficits. Moves all extremities spontaneously. Psych: Normal affect.  Labs    Troponin [facility] of Care Test) Recent Labs    10/25/18 1935  TROPIPOC 0.67*   Recent Labs    10/25/18 1910 10/25/18 2357 10/26/18 0620 10/26/18 1125  CKTOTAL 200  --   --   --   TROPONINI  --  0.48* 0.38* 0.34*   Lab Results  Component Value Date   WBC 16.1 (H) 10/26/2018   HGB 11.0 (L) 10/26/2018   HCT 33.4 (L) 10/26/2018   MCV 102.1 (H) 10/26/2018   PLT 196 10/26/2018    Recent Labs  Lab 10/26/18 0620  NA 132*  K 3.7  CL 105  CO2 22  BUN 55*  CREATININE 1.25*  CALCIUM 7.4*  PROT 5.5*  BILITOT 0.9  ALKPHOS 58  ALT 27  AST 37  GLUCOSE 146*   No results found for: CHOL, HDL, LDLCALC, TRIG No results found for: [facility]   Radiology Studies    Ct Abdomen Pelvis Wo Contrast  Result Date: 10/26/2018 CLINICAL DATA:  Fall EXAM: CT CHEST, ABDOMEN AND PELVIS WITHOUT CONTRAST TECHNIQUE: Multidetector CT imaging of the chest, abdomen and pelvis was performed following the standard protocol without IV contrast. COMPARISON:  CT  chest abdomen pelvis 10/09/2018 FINDINGS: CT CHEST FINDINGS Cardiovascular: Heart size is normal without pericardial effusion. The thoracic aorta is normal in course and caliber. There is moderate calcific coronary artery and aortic atherosclerosis. Mediastinum/Nodes: No mediastinal hematoma. No mediastinal, hilar or axillary lymphadenopathy. The visualized thyroid and thoracic esophageal course are unremarkable. Lungs/Pleura: Spiculated lesion in the right upper lobe, decreased from the prior study. There is a right upper lobe nodule that measures 11 mm, possibly new from the prior study. There is now consolidation of the right middle and lower lobes. Left upper lobe nodule has decreased in size. Nodule in the left lower lobe has substantially resolved. No left pleural effusion. Musculoskeletal: There are mildly displaced fractures of the right fifth through seventh ribs. CT ABDOMEN PELVIS FINDINGS Hepatobiliary: No hepatic hematoma or laceration. No biliary dilatation. Cholelithiasis without acute inflammation. Pancreas: Normal contours without ductal dilatation. No peripancreatic fluid collection. Spleen: No splenic laceration or hematoma. Adrenals/Urinary Tract: --Adrenal glands: No adrenal hemorrhage. --Right kidney/ureter: No hydronephrosis or perinephric hematoma. --Left kidney/ureter: Upper pole cyst measures 3.6 cm. --Urinary bladder: Unremarkable. Stomach/Bowel: --Stomach/Duodenum: No hiatal hernia or other gastric abnormality. Normal duodenal course and caliber. --Small bowel: No dilatation or inflammation. --Colon: Rectosigmoid diverticulosis without acute inflammation. --Appendix: Normal. Vascular/Lymphatic: Atherosclerotic calcification is present within the non-aneurysmal abdominal aorta, without hemodynamically significant stenosis. No abdominal or pelvic lymphadenopathy. Reproductive: Brachytherapy seeds within the prostate gland. Musculoskeletal. Bilateral sacroiliac ankylosis. Other: None.  IMPRESSION: 1. Mildly displaced fractures of the right fifth through seventh ribs. 2. New consolidation of the right middle and lower lobes. Etiology is uncertain. This is more posterior than the rib fractures and more opaque than typical contusion. It is more likely infectious or neoplastic. 3. Generally decreased size of multiple nodular opacities throughout the lungs. There is a single nodule in the right upper lobe that appears to have increased in size, though this may have been part of 1 of the more confluent nodular areas on the prior study. 4. Coronary artery and aortic atherosclerosis (ICD10-I70.0). Electronically Signed   By: Ulyses Jarred M.D.   On: 10/26/2018 01:04   Dg Ribs Unilateral W/chest Right  Result Date: 10/25/2018 CLINICAL DATA:  Fall 2 days ago.  Pain to lower ribs. EXAM: RIGHT RIBS AND CHEST - 3+ VIEW COMPARISON:  Chest x-ray October 25, 2014 FINDINGS: Elevation right hemidiaphragm persists. The heart, hila, and mediastinum are unchanged. Mild opacity in the bases, right greater than left. No overt edema. No other acute abnormalities. Apparent lateral right rib fracture at the site of pain. IMPRESSION: Right-sided rib fracture without pneumothorax. Right greater than left bibasilar opacities favored represent atelectasis rather than infiltrate. Electronically Signed   By: Dorise Bullion III M.D   On: 10/25/2018 19:48   Ct Chest Wo Contrast  Result Date: 10/26/2018 CLINICAL DATA:  Fall EXAM: CT CHEST, ABDOMEN AND PELVIS WITHOUT CONTRAST TECHNIQUE: Multidetector CT imaging of the chest, abdomen and pelvis was performed following the standard protocol without IV contrast. COMPARISON:  CT chest abdomen pelvis 10/09/2018 FINDINGS: CT CHEST FINDINGS Cardiovascular: Heart size is normal without pericardial effusion. The thoracic aorta is normal in course and caliber. There is moderate calcific coronary artery and aortic atherosclerosis. Mediastinum/Nodes: No mediastinal hematoma. No  mediastinal, hilar or axillary lymphadenopathy. The visualized thyroid and thoracic esophageal course are unremarkable. Lungs/Pleura: Spiculated lesion in the right upper lobe, decreased from the prior study. There is a right upper lobe nodule that measures 11 mm, possibly new from the prior study. There is now consolidation of the right middle and lower lobes. Left upper lobe nodule has decreased in size. Nodule in the left lower lobe has substantially resolved. No left pleural effusion. Musculoskeletal: There are mildly displaced fractures of the right fifth through seventh ribs. CT ABDOMEN PELVIS FINDINGS Hepatobiliary: No hepatic hematoma or laceration. No biliary dilatation. Cholelithiasis without acute inflammation. Pancreas: Normal contours without ductal dilatation. No peripancreatic fluid collection. Spleen: No splenic laceration or hematoma. Adrenals/Urinary Tract: --Adrenal glands: No adrenal hemorrhage. --Right kidney/ureter: No hydronephrosis or perinephric hematoma. --Left kidney/ureter: Upper pole cyst measures 3.6 cm. --Urinary bladder: Unremarkable. Stomach/Bowel: --Stomach/Duodenum: No hiatal hernia or other gastric abnormality. Normal duodenal course and caliber. --Small bowel: No dilatation or inflammation. --Colon: Rectosigmoid diverticulosis without acute inflammation. --Appendix: Normal. Vascular/Lymphatic: Atherosclerotic calcification is present within the non-aneurysmal abdominal aorta, without hemodynamically significant stenosis. No abdominal or pelvic lymphadenopathy. Reproductive: Brachytherapy seeds within the prostate gland. Musculoskeletal. Bilateral sacroiliac ankylosis. Other: None. IMPRESSION: 1. Mildly displaced fractures of the right fifth through seventh ribs. 2. New consolidation of the right middle and lower lobes. Etiology is uncertain. This is more posterior than the rib fractures and more opaque than typical contusion. It is more likely infectious or neoplastic. 3.  Generally decreased size of multiple nodular opacities throughout the lungs. There is a single nodule in the right upper lobe that appears to have increased in size, though this may have been part of 1 of the more confluent nodular areas on the prior study. 4. Coronary artery and aortic atherosclerosis (ICD10-I70.0). Electronically Signed   By: Ulyses Jarred M.D.   On: 10/26/2018 01:04   Nm Bone Scan Whole Body  Result Date: 10/09/2018 CLINICAL DATA:  Prostate cancer.  Evaluate for bone metastasis. EXAM: NUCLEAR MEDICINE WHOLE BODY BONE SCAN TECHNIQUE: Whole body anterior and posterior images were obtained approximately 3 hours after intravenous injection of radiopharmaceutical. RADIOPHARMACEUTICALS:  21.4 mCi Technetium-7m MDP IV COMPARISON:  Bone scan dated 11/27/2017. CT abdomen and pelvis dated 10/09/2018. FINDINGS: Again noted is the previously described osseous metastasis within the RIGHT acetabulum, inferior pubic ramus and proximal RIGHT femur, although perhaps slightly diminished intensity of the radiotracer uptake at the proximal femur. No evidence of  additional osseous metastasis on today's bone scan. Again noted is mild degenerative uptake about the bilateral shoulders and degenerative uptake at the sternoclavicular joint spaces. No abnormal soft tissue uptake. IMPRESSION: 1. Persistent abnormal radiotracer uptake within the RIGHT hemipelvis and proximal RIGHT femur, compatible with the previously described osseous metastases demonstrated on CT of 10/09/2018. Perhaps slightly decreased intensity of the radiotracer uptake at the proximal femur, of uncertain significance. 2. No evidence of new osseous metastasis on today's exam. Electronically Signed   By: Franki Cabot M.D.   On: 10/09/2018 16:25    EKG     EKG: EKG was personally reviewed and demonstrates: atrial fibrillation, rate 94 bpm, with incomplete RBBB, LAFB, and T wave inversion in lateral leads (new compared to EKG in  09/2016)  Telemetry: Telemetry was personally reviewed and demonstrates: atrial fibrillation with rate between 70s and 90s bpm and occasional PVCs.  Cardiac Imaging    Echocardiogram 10/26/2018: Study Conclusions: - Left ventricle: The cavity size was normal. Wall thickness was   increased in a pattern of mild LVH. Systolic function was   moderately reduced. The estimated ejection fraction was in the   range of 35% to 40%. Anterior, anteroseptal, apical and   inferoapical hypokinesis, and incoordinate septal motion. The   study is not technically sufficient to allow evaluation of LV   diastolic function. Ejection fraction (MOD, 2-plane): 40%. - Aortic valve: Mildly calcified leaflets with restriction of the   right coronary cusp. There was no stenosis. There was trivial   regurgitation. - Mitral valve: Calcified annulus. Mildly thickened leaflets . - Left atrium: Moderately dilated. - Tricuspid valve: There was mild regurgitation. - Pulmonary arteries: PA peak pressure: 58 mm Hg (S). - Inferior vena cava: The vessel was normal in size. The   respirophasic diameter changes were in the normal range (>= 50%),   consistent with normal central venous pressure.  Impressions: - Compared to a prior study in 2014, the LVEF is lower at 35-40%   with regional wall motion abnormalities. _______________  Echocardiogram 08/10/2013: Study Conclusions: - Left ventricle: The cavity size was normal. Wall thickness was normal. Systolic function was normal. The estimated ejection fraction was in the range of 55% to 60%. Regional wall motion abnormalities cannot be excluded. The study is not technically sufficient to allow evaluation of LV diastolic function. - Aortic valve: Trileaflet; mildly calcified leaflets. Mild regurgitation. - Mitral valve: Calcified annulus. Mildly thickened leaflets . Mild regurgitation. - Left atrium: LA Volume/BSA 33 ml/m2. The atrium was  mildly dilated. - Right atrium: The atrium was moderately dilated. - Tricuspid valve: Structurally normal valve. Mild regurgitation. - Pulmonary arteries: PA peak pressure: 8mm Hg (S). - Inferior vena cava: The vessel was normal in size; the respirophasic diameter changes were in the normal range (= 50%); findings are consistent with normal central venous pressure.  Assessment & Plan    1. New Onset Atrial Fibrillation - Patient presented to the ED for evaluation of right rib pain after mechanical fall 2 days prior and fever. Patient found to be in atrial fibrillation upon arrival to the ED. - Patient has history of irregular rhythm related to PACs, blocked PACs, and 2nd degree AV block Mobitz type 1 on Event Monitor in 2014.  - TSH low at 0.076. - Patient is currently rate controlled. Telemetry shows patient is still in atrial fibrillation with rate between 70s and 90s bpm. - CHADSVASC2 =  4 (HTN, T2DM, age x2). Patient will need anticoagulation with DOAC.  -  Per Dr. Victorino December last office visit note, all negative chronotropic agents should be avoided.   2. Elevated Troponin with EKG Changes - EKG showed atrial fibrillation with T wave inversions in inferolateral leads (new compared to tracing in 2014).  - I-stat troponin minimally elevated at 0.67. Repeat troponin elevated at 0.48 >> 0.38.  - Echo showed LVEF 35-40% (down from 55-60% in 2014) with anterior, anteroseptal, apical, and inferoapical hypokinesis, and incoordinate septal motion.  - Patient denies any ischemic symptoms. No angina or dyspnea. - Troponin only minimally elevated; however, given cardiovascular risk factors (HTN, HLD, T2DM), age, EKG changes, and Echo result, patient may benefit from ischemic workup with catheterization. However, given renal function may need to hold off at this time. Will also depend on how aggressive of a work-up patient would like. Echo changes may also represent a Takotsubo variant in  the setting of a recent fall and pneumonia. Will discuss with MD.   3. Cardiomyopathy with LVEF of 35-40% - Echo as above. - Will hold home Losartan at this time due to renal function.  - Will hold off on starting beta-blocker as above.   4. Hypertension - Most recent BP soft but stable at 100/59.  - Continue IV hydralazine as needed. - Continue to hold Losartan as above.   5. AKI - SCr 1.41 upon admission (0.95 in 2011). Patient received IV fluids. - SCr 1.25 today.  6. Pneumonia - Continue antibiotics per primary team.   Signed, Darreld Mclean, PA-C 10/26/2018, 1:36 PM   I have examined the patient and reviewed assessment and plan and discussed with patient.  Agree with above as stated.  I had a long discussion with the patient and family.  His initial fall was because because he was moving too quickly.  He fell in his bathroom and was lodged between the tub and commode for nearly 6 hours.  He could feel a severe pain, likely when his rib fractured.  He is now in atrial fibrillation.  Echocardiogram shows an apical wall motion abnormality.  I personally reviewed the echocardiogram images.  I personally reviewed the ECG as well.  I think there are 2 possibilities.  He could have a large wraparound LAD with a stenosis.  I think the other possibility is that this is a stress-induced cardiomyopathy.  On the echocardiogram.  The wall motion abnormality is quite symmetric involving the mid to distal inferior, lateral, anterior and apical walls.    He remains asymptomatic.  He denies palpitations or chest discomfort.  We discussed the possibility of angiography with the patient.  At this point, he is not interested.  He would prefer medical therapy.  I think we can plan to repeat an ultrasound of his heart in 6 to 8 weeks.  If his wall motion abnormality has improved, this is likely a stress-induced cardiomyopathy.  He has had some renal insufficiency and if we are not planning on doing any  invasive testing, I would not do a CT angiogram.  The daughter was present during this discussion.  She also mentions that he has some mild dementia.  I think, from a heart standpoint, he should start on an anticoagulant such as Eliquis to reduce his stroke risk.  Physical therapy evaluation will be very important for him.  I stressed the importance of avoiding falls.  His chads vasc score is very high given his age and now LV dysfunction.  Recs: Eliquis for stroke prevention. Medical Rx for low EF.  There  was a concern about starting beta-blocker in the past due to trifascicular block.  Geoffrey Bennett   For questions or updates, please contact   Please consult [email or web address] for contact info under Cardiology/STEMI.

## 2018-10-26 NOTE — Progress Notes (Signed)
Pt was not able to sleep because he was scared of the way his breathing sounds because of the fluid in him. We got a albuterol ordered and gave him a respiratory treatment. Afterward pt stated " It feels a little better". But pt was still coughing and It sounded like there was still fluid stuck.

## 2018-10-26 NOTE — Progress Notes (Signed)
ANTICOAGULATION CONSULT NOTE - Initial Consult  Pharmacy Consult for Apixaban Indication: atrial fibrillation  No Known Allergies  Patient Measurements: Height: 5\' 8"  (172.7 cm) Weight: 188 lb 11.4 oz (85.6 kg) IBW/kg (Calculated) : 68.4  Vital Signs: Temp: 98.8 F (37.1 C) (11/04 1333) Temp Source: Oral (11/04 1333) BP: 119/56 (11/04 1333) Pulse Rate: 75 (11/04 1333)  Labs: Recent Labs    10/25/18 1910 10/25/18 1920 10/25/18 2357 10/26/18 0620 10/26/18 1125  HGB  --  11.7*  --  11.0*  --   HCT  --  36.2*  --  33.4*  --   PLT  --  202  --  196  --   CREATININE  --  1.41*  --  1.25*  --   CKTOTAL 200  --   --   --   --   TROPONINI  --   --  0.48* 0.38* 0.34*    Estimated Creatinine Clearance: 39.3 mL/min (A) (by C-G formula based on SCr of 1.25 mg/dL (H)).   Medical History: Past Medical History:  Diagnosis Date  . CAP (community acquired pneumonia)   . Cataract   . Diabetes mellitus without complication ([place])   . Fracture of one rib, unsp side, init for clos fx   . Glaucoma   . Hypertension   . NSTEMI (non-ST elevated myocardial infarction) ([place])   . prostate ca dx'd 2008   seed implant  . Retinal detachment    of left eye with laser surgery    Assessment: 84 yo male with new onset atrial fibrillation. Pharmacy consulted for Apixaban dosing.  Current dose recommendation is Apixaban 5 mg po bid.  He will need to be monitored for renal dysfunction.  If his creatinine increased the 1.5 mg/dl or greater he should be dose reduced.   Plan:  Start Apixaban 5 mg po bid  Alanda Slim, PharmD, [facility] Clinical Pharmacist Please see AMION for all Pharmacists' Contact Phone Numbers 10/26/2018, 3:18 PM

## 2018-10-26 NOTE — Plan of Care (Signed)
  Problem: Education: Goal: Knowledge of General Education information will improve Description Including pain rating scale, medication(s)/side effects and non-pharmacologic comfort measures Outcome: Progressing   Problem: Clinical Measurements: Goal: Diagnostic test results will improve Outcome: Progressing Goal: Respiratory complications will improve Outcome: Progressing Goal: Cardiovascular complication will be avoided Outcome: Progressing   Problem: Activity: Goal: Risk for activity intolerance will decrease Outcome: Progressing   Problem: Coping: Goal: Level of anxiety will decrease Outcome: Progressing   Problem: Elimination: Goal: Will not experience complications related to bowel motility Outcome: Progressing   Problem: Pain Managment: Goal: General experience of comfort will improve Outcome: Progressing   Problem: Skin Integrity: Goal: Risk for impaired skin integrity will decrease Outcome: Progressing   Problem: Nutrition: Goal: Adequate nutrition will be maintained Outcome: Not Progressing   Problem: Health Behavior/Discharge Planning: Goal: Ability to manage health-related needs will improve Outcome: Not Met (add Reason)   Problem: Clinical Measurements: Goal: Ability to maintain clinical measurements within normal limits will improve Outcome: Not Met (add Reason) Goal: Will remain free from infection Outcome: Not Met (add Reason)   Problem: Elimination: Goal: Will not experience complications related to urinary retention Outcome: Not Met (add Reason)   Problem: Safety: Goal: Ability to remain free from injury will improve Outcome: Not Met (add Reason)

## 2018-10-26 NOTE — Progress Notes (Signed)
  Echocardiogram 2D Echocardiogram has been performed.  Geoffrey Bennett 10/26/2018, 9:45 AM

## 2018-10-27 LAB — LEGIONELLA PNEUMOPHILA SEROGP 1 UR AG: L. PNEUMOPHILA SEROGP 1 UR AG: NEGATIVE

## 2018-10-27 LAB — BASIC METABOLIC PANEL
Anion gap: 9 (ref 5–15)
BUN: 58 mg/dL — ABNORMAL HIGH (ref 8–23)
CHLORIDE: 102 mmol/L (ref 98–111)
CO2: 23 mmol/L (ref 22–32)
Calcium: 7.4 mg/dL — ABNORMAL LOW (ref 8.9–10.3)
Creatinine, Ser: 1.21 mg/dL (ref 0.61–1.24)
GFR calc Af Amer: 58 mL/min — ABNORMAL LOW (ref >60)
GFR calc non Af Amer: 50 mL/min — ABNORMAL LOW (ref >60)
Glucose, Bld: 175 mg/dL — ABNORMAL HIGH (ref 70–99)
POTASSIUM: 3.9 mmol/L (ref 3.5–5.1)
SODIUM: 134 mmol/L — AB (ref 135–145)

## 2018-10-27 LAB — CBC
HEMATOCRIT: 34.9 % — AB (ref 39.0–52.0)
HEMOGLOBIN: 11.4 g/dL — AB (ref 13.0–17.0)
MCH: 33.4 pg (ref 26.0–34.0)
MCHC: 32.7 g/dL (ref 30.0–36.0)
MCV: 102.3 fL — ABNORMAL HIGH (ref 80.0–100.0)
NRBC: 0 % (ref 0.0–0.2)
Platelets: 239 10*3/uL (ref 150–400)
RBC: 3.41 MIL/uL — AB (ref 4.22–5.81)
RDW: 13.1 % (ref 11.5–15.5)
WBC: 14.3 10*3/uL — AB (ref 4.0–10.5)

## 2018-10-27 LAB — GLUCOSE, CAPILLARY
GLUCOSE-CAPILLARY: 173 mg/dL — AB (ref 70–99)
Glucose-Capillary: 187 mg/dL — ABNORMAL HIGH (ref 70–99)
Glucose-Capillary: 178 mg/dL — ABNORMAL HIGH (ref 70–99)
Glucose-Capillary: 165 mg/dL — ABNORMAL HIGH (ref 70–99)

## 2018-10-27 LAB — URINE CULTURE: CULTURE: NO GROWTH

## 2018-10-27 MED ORDER — SENNA 8.6 MG PO TABS
1.0000 | ORAL_TABLET | Freq: Every day | ORAL | Status: DC
  Administered 2018-10-27: 8.6 mg via ORAL
  Filled 2018-10-27 (×3): qty 1

## 2018-10-27 MED ORDER — IPRATROPIUM-ALBUTEROL 0.5-2.5 (3) MG/3ML IN SOLN
3.0000 mL | Freq: Three times a day (TID) | RESPIRATORY_TRACT | Status: DC
  Administered 2018-10-27 – 2018-10-28 (×4): 3 mL via RESPIRATORY_TRACT
  Filled 2018-10-27 (×4): qty 3

## 2018-10-27 NOTE — Progress Notes (Addendum)
[facility] PROGRESS NOTE  Geoffrey Bennett YKZ:[id number] DOB: 1922-04-21 DOA: 10/25/2018  PCP: Deland Pretty, MD  Brief History/Interval Summary:  84 y.o. male with medical history significant of diabetes mellitus type 2, hypertension, hyperlipidemia, left eye blind, prostate cancer was brought to the ER after patient was found to have fever and cough.  Evaluation in the emergency department revealed rib fracture.  This is likely due to the fall that the patient sustained a few days prior to hospitalization.  There was also concern for pneumonia.  EKG showed atrial fibrillation.  Patient was found to have elevated troponin.  He was hospitalized for further management.  Reason for Visit: Community-acquired pneumonia.  Atrial fibrillation.  Consultants: Cardiology  Procedures:   Transthoracic echocardiogram Study Conclusions  - Left ventricle: The cavity size was normal. Wall thickness was   increased in a pattern of mild LVH. Systolic function was   moderately reduced. The estimated ejection fraction was in the   range of 35% to 40%. Anterior, anteroseptal, apical and   inferoapical hypokinesis, and incoordinate septal motion. The   study is not technically sufficient to allow evaluation of LV   diastolic function. Ejection fraction (MOD, 2-plane): 40%. - Aortic valve: Mildly calcified leaflets with restriction of the   right coronary cusp. There was no stenosis. There was trivial   regurgitation. - Mitral valve: Calcified annulus. Mildly thickened leaflets . - Left atrium: Moderately dilated. - Tricuspid valve: There was mild regurgitation. - Pulmonary arteries: PA peak pressure: 58 mm Hg (S). - Inferior vena cava: The vessel was normal in size. The   respirophasic diameter changes were in the normal range (>= 50%),   consistent with normal central venous pressure.  Impressions:  - Compared to a prior study in 2014, the LVEF is lower at 35-40%   with regional wall  motion abnormalities.   Antibiotics: Ceftriaxone and azithromycin  Subjective/Interval History: Patient states that he feels better today compared to yesterday.  Denies any cough.  Still has some degree of shortness of breath.  Denies any chest pain.  ROS: Denies any nausea or vomiting  Objective:  Vital Signs  Vitals:   10/27/18 0128 10/27/18 0414 10/27/18 0451 10/27/18 0751  BP: (!) 101/51  105/78   Pulse: 72  74 75  Resp: 18  17 20   Temp: 98.6 F (37 C)  98.6 F (37 C)   TempSrc: Oral  Oral   SpO2: 97%  92% 93%  Weight:  83.8 kg    Height:        Intake/Output Summary (Last 24 hours) at 10/27/2018 1042 Last data filed at 10/27/2018 0900 Gross per 24 hour  Intake 1987.26 ml  Output 175 ml  Net 1812.26 ml   Filed Weights   10/25/18 2306 10/26/18 0454 10/27/18 0414  Weight: 85 kg 85.6 kg 83.8 kg    General appearance: alert, cooperative, appears stated age and no distress Head: Normocephalic, without obvious abnormality, atraumatic Resp: Mildly tachypneic at rest.  No use of accessory muscles.  Coarse breath sounds bilaterally.  Crackles at the bases right more than left.  No wheezing.  No rhonchi. Cardio: S1-S2 is irregularly irregular.  No S3-S4.  No rubs murmurs or bruit GI: soft, non-tender; bowel sounds normal; no masses,  no organomegaly Extremities: Minimal edema bilateral lower extremities Pulses: 2+ and symmetric Neurologic: Awake alert.  Moving all his extremities.  No focal deficits.  Lab Results:  Data Reviewed: I have personally reviewed following labs and imaging  studies  CBC: Recent Labs  Lab 10/25/18 1920 10/26/18 0620 10/27/18 0418  WBC 17.9* 16.1* 14.3*  NEUTROABS 15.6* 14.0*  --   HGB 11.7* 11.0* 11.4*  HCT 36.2* 33.4* 34.9*  MCV 102.0* 102.1* 102.3*  PLT 202 196 485    Basic Metabolic Panel: Recent Labs  Lab 10/25/18 1920 10/25/18 2357 10/26/18 0620 10/27/18 0418  NA 130*  --  132* 134*  K 3.5  --  3.7 3.9  CL 99  --  105  102  CO2 20*  --  22 23  GLUCOSE 222*  --  146* 175*  BUN 52*  --  55* 58*  CREATININE 1.41*  --  1.25* 1.21  CALCIUM 7.9*  --  7.4* 7.4*  MG  --  1.8  --   --     GFR: Estimated Creatinine Clearance: 40.2 mL/min (by C-G formula based on SCr of 1.21 mg/dL).  Liver Function Tests: Recent Labs  Lab 10/25/18 1920 10/26/18 0620  AST 33 37  ALT 26 27  ALKPHOS 59 58  BILITOT 1.0 0.9  PROT 5.7* 5.5*  ALBUMIN 2.5* 2.3*    Cardiac Enzymes: Recent Labs  Lab 10/25/18 1910 10/25/18 2357 10/26/18 0620 10/26/18 1125  CKTOTAL 200  --   --   --   TROPONINI  --  0.48* 0.38* 0.34*    HbA1C: Recent Labs    10/26/18 0620  HGBA1C 6.2*    CBG: Recent Labs  Lab 10/26/18 0733 10/26/18 1122 10/26/18 1604 10/26/18 2147 10/27/18 0729  GLUCAP 144* 169* 176* 167* 187*    Thyroid Function Tests: Recent Labs    10/25/18 1530 10/25/18 2357  TSH  --  0.076*  FREET4 1.38  --      Recent Results (from the past 240 hour(s))  Blood Culture (routine x 2)     Status: None (Preliminary result)   Collection Time: 10/25/18  7:20 PM  Result Value Ref Range Status   Specimen Description BLOOD BLOOD RIGHT FOREARM  Final   Special Requests   Final    BOTTLES DRAWN AEROBIC AND ANAEROBIC Blood Culture adequate volume   Culture   Final    NO GROWTH < 24 HOURS Performed at [facility], [place] [address], [place], [place] [address]    Report Status PENDING  Incomplete  Blood Culture (routine x 2)     Status: None (Preliminary result)   Collection Time: 10/25/18  7:36 PM  Result Value Ref Range Status   Specimen Description BLOOD BLOOD RIGHT HAND  Final   Special Requests   Final    BOTTLES DRAWN AEROBIC AND ANAEROBIC Blood Culture results may not be optimal due to an excessive volume of blood received in culture bottles   Culture   Final    NO GROWTH < 24 HOURS Performed at [facility], [place] [address] [place], [place] [address]    Report Status PENDING  Incomplete    Urine culture     Status: None   Collection Time: 10/25/18  9:12 PM  Result Value Ref Range Status   Specimen Description URINE, RANDOM  Final   Special Requests NONE  Final   Culture   Final    NO GROWTH Performed at [facility], [address], [place], [place] [address]    Report Status 10/27/2018 FINAL  Final      Radiology Studies: Ct Abdomen Pelvis Wo Contrast  Result Date: 10/26/2018 CLINICAL DATA:  Fall EXAM:  CT CHEST, ABDOMEN AND PELVIS WITHOUT CONTRAST TECHNIQUE: Multidetector CT imaging of the chest, abdomen and pelvis was performed following the standard protocol without IV contrast. COMPARISON:  CT chest abdomen pelvis 10/09/2018 FINDINGS: CT CHEST FINDINGS Cardiovascular: Heart size is normal without pericardial effusion. The thoracic aorta is normal in course and caliber. There is moderate calcific coronary artery and aortic atherosclerosis. Mediastinum/Nodes: No mediastinal hematoma. No mediastinal, hilar or axillary lymphadenopathy. The visualized thyroid and thoracic esophageal course are unremarkable. Lungs/Pleura: Spiculated lesion in the right upper lobe, decreased from the prior study. There is a right upper lobe nodule that measures 11 mm, possibly new from the prior study. There is now consolidation of the right middle and lower lobes. Left upper lobe nodule has decreased in size. Nodule in the left lower lobe has substantially resolved. No left pleural effusion. Musculoskeletal: There are mildly displaced fractures of the right fifth through seventh ribs. CT ABDOMEN PELVIS FINDINGS Hepatobiliary: No hepatic hematoma or laceration. No biliary dilatation. Cholelithiasis without acute inflammation. Pancreas: Normal contours without ductal dilatation. No peripancreatic fluid collection. Spleen: No splenic laceration or hematoma. Adrenals/Urinary Tract: --Adrenal glands: No adrenal hemorrhage. --Right kidney/ureter: No hydronephrosis or perinephric hematoma. --Left  kidney/ureter: Upper pole cyst measures 3.6 cm. --Urinary bladder: Unremarkable. Stomach/Bowel: --Stomach/Duodenum: No hiatal hernia or other gastric abnormality. Normal duodenal course and caliber. --Small bowel: No dilatation or inflammation. --Colon: Rectosigmoid diverticulosis without acute inflammation. --Appendix: Normal. Vascular/Lymphatic: Atherosclerotic calcification is present within the non-aneurysmal abdominal aorta, without hemodynamically significant stenosis. No abdominal or pelvic lymphadenopathy. Reproductive: Brachytherapy seeds within the prostate gland. Musculoskeletal. Bilateral sacroiliac ankylosis. Other: None. IMPRESSION: 1. Mildly displaced fractures of the right fifth through seventh ribs. 2. New consolidation of the right middle and lower lobes. Etiology is uncertain. This is more posterior than the rib fractures and more opaque than typical contusion. It is more likely infectious or neoplastic. 3. Generally decreased size of multiple nodular opacities throughout the lungs. There is a single nodule in the right upper lobe that appears to have increased in size, though this may have been part of 1 of the more confluent nodular areas on the prior study. 4. Coronary artery and aortic atherosclerosis (ICD10-I70.0). Electronically Signed   By: Ulyses Jarred M.D.   On: 10/26/2018 01:04   Dg Ribs Unilateral W/chest Right  Result Date: 10/25/2018 CLINICAL DATA:  Fall 2 days ago.  Pain to lower ribs. EXAM: RIGHT RIBS AND CHEST - 3+ VIEW COMPARISON:  Chest x-ray October 25, 2014 FINDINGS: Elevation right hemidiaphragm persists. The heart, hila, and mediastinum are unchanged. Mild opacity in the bases, right greater than left. No overt edema. No other acute abnormalities. Apparent lateral right rib fracture at the site of pain. IMPRESSION: Right-sided rib fracture without pneumothorax. Right greater than left bibasilar opacities favored represent atelectasis rather than infiltrate.  Electronically Signed   By: Dorise Bullion III M.D   On: 10/25/2018 19:48   Ct Chest Wo Contrast  Result Date: 10/26/2018 CLINICAL DATA:  Fall EXAM: CT CHEST, ABDOMEN AND PELVIS WITHOUT CONTRAST TECHNIQUE: Multidetector CT imaging of the chest, abdomen and pelvis was performed following the standard protocol without IV contrast. COMPARISON:  CT chest abdomen pelvis 10/09/2018 FINDINGS: CT CHEST FINDINGS Cardiovascular: Heart size is normal without pericardial effusion. The thoracic aorta is normal in course and caliber. There is moderate calcific coronary artery and aortic atherosclerosis. Mediastinum/Nodes: No mediastinal hematoma. No mediastinal, hilar or axillary lymphadenopathy. The visualized thyroid and thoracic esophageal course are unremarkable. Lungs/Pleura: Spiculated lesion in  the right upper lobe, decreased from the prior study. There is a right upper lobe nodule that measures 11 mm, possibly new from the prior study. There is now consolidation of the right middle and lower lobes. Left upper lobe nodule has decreased in size. Nodule in the left lower lobe has substantially resolved. No left pleural effusion. Musculoskeletal: There are mildly displaced fractures of the right fifth through seventh ribs. CT ABDOMEN PELVIS FINDINGS Hepatobiliary: No hepatic hematoma or laceration. No biliary dilatation. Cholelithiasis without acute inflammation. Pancreas: Normal contours without ductal dilatation. No peripancreatic fluid collection. Spleen: No splenic laceration or hematoma. Adrenals/Urinary Tract: --Adrenal glands: No adrenal hemorrhage. --Right kidney/ureter: No hydronephrosis or perinephric hematoma. --Left kidney/ureter: Upper pole cyst measures 3.6 cm. --Urinary bladder: Unremarkable. Stomach/Bowel: --Stomach/Duodenum: No hiatal hernia or other gastric abnormality. Normal duodenal course and caliber. --Small bowel: No dilatation or inflammation. --Colon: Rectosigmoid diverticulosis without acute  inflammation. --Appendix: Normal. Vascular/Lymphatic: Atherosclerotic calcification is present within the non-aneurysmal abdominal aorta, without hemodynamically significant stenosis. No abdominal or pelvic lymphadenopathy. Reproductive: Brachytherapy seeds within the prostate gland. Musculoskeletal. Bilateral sacroiliac ankylosis. Other: None. IMPRESSION: 1. Mildly displaced fractures of the right fifth through seventh ribs. 2. New consolidation of the right middle and lower lobes. Etiology is uncertain. This is more posterior than the rib fractures and more opaque than typical contusion. It is more likely infectious or neoplastic. 3. Generally decreased size of multiple nodular opacities throughout the lungs. There is a single nodule in the right upper lobe that appears to have increased in size, though this may have been part of 1 of the more confluent nodular areas on the prior study. 4. Coronary artery and aortic atherosclerosis (ICD10-I70.0). Electronically Signed   By: Ulyses Jarred M.D.   On: 10/26/2018 01:04     Medications:  Scheduled: . apixaban  5 mg Oral BID  . aspirin EC  81 mg Oral Daily  . atorvastatin  20 mg Oral QHS  . brimonidine  1 drop Left Eye QHS  . insulin aspart  0-9 Units Subcutaneous TID WC  . ipratropium-albuterol  3 mL Nebulization TID  . timolol  1 drop Left Eye QHS   Continuous: . sodium chloride Stopped (10/27/18 0145)  . azithromycin Stopped (10/27/18 0015)  . cefTRIAXone (ROCEPHIN)  IV Stopped (10/26/18 2237)   WJX:BJYNWG chloride, acetaminophen, albuterol, hydrALAZINE, ondansetron **OR** ondansetron (ZOFRAN) IV  Assessment/Plan:    Community-acquired pneumonia Patient was febrile at home.  Imaging studies have suggested consolidation in the right lung.  Patient placed on ceftriaxone and azithromycin.  Influenza PCR negative.  Follow-up on cultures.  Respiratory status appears to be stable.  Wean down oxygen as tolerated.  Will request speech therapy to  evaluate patient to remove assess aspiration risk.  Non-ST elevation MI Echocardiogram reveals EF of 35-40% with several wall motion abnormalities.  Patient denies any chest pain.  He is on aspirin and statin.  None noted to be on beta-blocke do to past trifascicular block.  Cardiology is following.  No plans for any invasive work-up currently.  New onset atrial fibrillation Heart rate appears to be controlled.  Chads 2 vascular score is high and anticoagulation is recommended.  Seen by cardiology.  Patient started on Eliquis.  Not noted to be on any rate control medications at this time.  Acute systolic CHF EF 95%.  No clear evidence for volume overload currently.  Continue to monitor daily weights.  Strict ins and outs.  No ACE inhibitor or ARB due to elevated creatinine.  Creatinine appears to be improving.  Patient was on losartan at home.  Could reconsider restarting this in the next 1 to 2 days if renal function continues to show improvement.  Blood pressure is noted to be soft.  Right rib fractures No pneumothorax noted on imaging studies.  This is secondary to fall.  Continue to monitor.  Acute renal failure/hyponatremia Creatinine was 1.41 at admission.  Has improved.  Unclear if the patient has chronic kidney disease.  Noted urine output.  Macrocytic anemia TSH was noted to be low but free T4 is normal.  Hemoglobin is stable.  We will check anemia panel.  History of prostate cancer with osseous metastases Followed by Dr. Diona Fanti.  Stable  Diabetes mellitus type 2 Monitor CBGs.  SSI.  HbA1c 6.2.  Abnormal thyroid function tests As mentioned above TSH low but normal free T4.  Possibly sick euthyroid.  Recheck thyroid function tests in a few weeks.  DVT Prophylaxis: Apixaban    Code Status: DNR Family Communication: Discussed with patient's daughter who was adamant about taking patient home today.  Advised her that it would be prudent to keep the patient at least one more day  in the hospital for swallow assessment and IV antibiotics.  She agrees but is quite certain that she would want to take the patient home tomorrow. Disposition Plan: Management as outlined above.  Await improvement in pneumonia.  Speech therapy to see.  Mobilize with physical therapy.     LOS: 1 day   [facility] Pager [phone] 10/27/2018, 10:42 AM  If 7PM-7AM, please contact night-coverage at [email or web address], password [facility]

## 2018-10-27 NOTE — Progress Notes (Addendum)
Progress Note  Patient Name: Geoffrey Bennett Date of Encounter: 10/27/2018  Primary Cardiologist: Sanda Klein, MD   Subjective   No significant overnight events. Patient continues to have a mild wet cough on exam. He denies any chest pain, shortness of breath, palpitations, lightheadedness, or dizziness.   Inpatient Medications    Scheduled Meds: . apixaban  5 mg Oral BID  . aspirin EC  81 mg Oral Daily  . atorvastatin  20 mg Oral QHS  . brimonidine  1 drop Left Eye QHS  . insulin aspart  0-9 Units Subcutaneous TID WC  . ipratropium-albuterol  3 mL Nebulization TID  . timolol  1 drop Left Eye QHS   Continuous Infusions: . sodium chloride Stopped (10/27/18 0145)  . azithromycin Stopped (10/27/18 0015)  . cefTRIAXone (ROCEPHIN)  IV Stopped (10/26/18 2237)   PRN Meds: sodium chloride, acetaminophen, albuterol, hydrALAZINE, ondansetron **OR** ondansetron (ZOFRAN) IV   Vital Signs    Vitals:   10/27/18 0128 10/27/18 0414 10/27/18 0451 10/27/18 0751  BP: (!) 101/51  105/78   Pulse: 72  74 75  Resp: 18  17 20   Temp: 98.6 F (37 C)  98.6 F (37 C)   TempSrc: Oral  Oral   SpO2: 97%  92% 93%  Weight:  83.8 kg    Height:        Intake/Output Summary (Last 24 hours) at 10/27/2018 0915 Last data filed at 10/27/2018 0208 Gross per 24 hour  Intake 1747.26 ml  Output 175 ml  Net 1572.26 ml   Filed Weights   10/25/18 2306 10/26/18 0454 10/27/18 0414  Weight: 85 kg 85.6 kg 83.8 kg    Telemetry    Atrial fibrillation with PVCs and very brief runs of non-sustained VT up to 3 beats. - Personally Reviewed   Physical Exam   GEN: 84 year old male sitting comfortably in chair. Alert and in no acute distress.   Neck: Supple.  Cardiac: Irregularly irregular rhythm with normal rate. No murmurs, rubs, or gallops.  Respiratory: No increased work of breathing. Decreased breath sounds in right base. No wheezes, rhonchi, or rales appreciated. GI: Abdomen soft, nontender,  non-distended  MS: Mild pitting edema of bilateral lower extremities around ankles. Neuro:  No focal deficits. Psych: Normal affect.  Labs    Chemistry Recent Labs  Lab 10/25/18 1920 10/26/18 0620 10/27/18 0418  NA 130* 132* 134*  K 3.5 3.7 3.9  CL 99 105 102  CO2 20* 22 23  GLUCOSE 222* 146* 175*  BUN 52* 55* 58*  CREATININE 1.41* 1.25* 1.21  CALCIUM 7.9* 7.4* 7.4*  PROT 5.7* 5.5*  --   ALBUMIN 2.5* 2.3*  --   AST 33 37  --   ALT 26 27  --   ALKPHOS 59 58  --   BILITOT 1.0 0.9  --   GFRNONAA 41* 48* 50*  GFRAA 48* 55* 58*  ANIONGAP 11 5 9      Hematology Recent Labs  Lab 10/25/18 1920 10/26/18 0620 10/27/18 0418  WBC 17.9* 16.1* 14.3*  RBC 3.55* 3.27* 3.41*  HGB 11.7* 11.0* 11.4*  HCT 36.2* 33.4* 34.9*  MCV 102.0* 102.1* 102.3*  MCH 33.0 33.6 33.4  MCHC 32.3 32.9 32.7  RDW 12.9 13.0 13.1  PLT 202 196 239    Cardiac Enzymes Recent Labs  Lab 10/25/18 2357 10/26/18 0620 10/26/18 1125  TROPONINI 0.48* 0.38* 0.34*    Recent Labs  Lab 10/25/18 1935  TROPIPOC 0.67*  BNPNo results for input(s): BNP, PROBNP in the last 168 hours.   DDimer No results for input(s): DDIMER in the last 168 hours.   Radiology    Ct Abdomen Pelvis Wo Contrast  Result Date: 10/26/2018 CLINICAL DATA:  Fall EXAM: CT CHEST, ABDOMEN AND PELVIS WITHOUT CONTRAST TECHNIQUE: Multidetector CT imaging of the chest, abdomen and pelvis was performed following the standard protocol without IV contrast. COMPARISON:  CT chest abdomen pelvis 10/09/2018 FINDINGS: CT CHEST FINDINGS Cardiovascular: Heart size is normal without pericardial effusion. The thoracic aorta is normal in course and caliber. There is moderate calcific coronary artery and aortic atherosclerosis. Mediastinum/Nodes: No mediastinal hematoma. No mediastinal, hilar or axillary lymphadenopathy. The visualized thyroid and thoracic esophageal course are unremarkable. Lungs/Pleura: Spiculated lesion in the right upper lobe,  decreased from the prior study. There is a right upper lobe nodule that measures 11 mm, possibly new from the prior study. There is now consolidation of the right middle and lower lobes. Left upper lobe nodule has decreased in size. Nodule in the left lower lobe has substantially resolved. No left pleural effusion. Musculoskeletal: There are mildly displaced fractures of the right fifth through seventh ribs. CT ABDOMEN PELVIS FINDINGS Hepatobiliary: No hepatic hematoma or laceration. No biliary dilatation. Cholelithiasis without acute inflammation. Pancreas: Normal contours without ductal dilatation. No peripancreatic fluid collection. Spleen: No splenic laceration or hematoma. Adrenals/Urinary Tract: --Adrenal glands: No adrenal hemorrhage. --Right kidney/ureter: No hydronephrosis or perinephric hematoma. --Left kidney/ureter: Upper pole cyst measures 3.6 cm. --Urinary bladder: Unremarkable. Stomach/Bowel: --Stomach/Duodenum: No hiatal hernia or other gastric abnormality. Normal duodenal course and caliber. --Small bowel: No dilatation or inflammation. --Colon: Rectosigmoid diverticulosis without acute inflammation. --Appendix: Normal. Vascular/Lymphatic: Atherosclerotic calcification is present within the non-aneurysmal abdominal aorta, without hemodynamically significant stenosis. No abdominal or pelvic lymphadenopathy. Reproductive: Brachytherapy seeds within the prostate gland. Musculoskeletal. Bilateral sacroiliac ankylosis. Other: None. IMPRESSION: 1. Mildly displaced fractures of the right fifth through seventh ribs. 2. New consolidation of the right middle and lower lobes. Etiology is uncertain. This is more posterior than the rib fractures and more opaque than typical contusion. It is more likely infectious or neoplastic. 3. Generally decreased size of multiple nodular opacities throughout the lungs. There is a single nodule in the right upper lobe that appears to have increased in size, though this may  have been part of 1 of the more confluent nodular areas on the prior study. 4. Coronary artery and aortic atherosclerosis (ICD10-I70.0). Electronically Signed   By: Ulyses Jarred M.D.   On: 10/26/2018 01:04   Dg Ribs Unilateral W/chest Right  Result Date: 10/25/2018 CLINICAL DATA:  Fall 2 days ago.  Pain to lower ribs. EXAM: RIGHT RIBS AND CHEST - 3+ VIEW COMPARISON:  Chest x-ray October 25, 2014 FINDINGS: Elevation right hemidiaphragm persists. The heart, hila, and mediastinum are unchanged. Mild opacity in the bases, right greater than left. No overt edema. No other acute abnormalities. Apparent lateral right rib fracture at the site of pain. IMPRESSION: Right-sided rib fracture without pneumothorax. Right greater than left bibasilar opacities favored represent atelectasis rather than infiltrate. Electronically Signed   By: Dorise Bullion III M.D   On: 10/25/2018 19:48   Ct Chest Wo Contrast  Result Date: 10/26/2018 CLINICAL DATA:  Fall EXAM: CT CHEST, ABDOMEN AND PELVIS WITHOUT CONTRAST TECHNIQUE: Multidetector CT imaging of the chest, abdomen and pelvis was performed following the standard protocol without IV contrast. COMPARISON:  CT chest abdomen pelvis 10/09/2018 FINDINGS: CT CHEST FINDINGS Cardiovascular: Heart size is normal  without pericardial effusion. The thoracic aorta is normal in course and caliber. There is moderate calcific coronary artery and aortic atherosclerosis. Mediastinum/Nodes: No mediastinal hematoma. No mediastinal, hilar or axillary lymphadenopathy. The visualized thyroid and thoracic esophageal course are unremarkable. Lungs/Pleura: Spiculated lesion in the right upper lobe, decreased from the prior study. There is a right upper lobe nodule that measures 11 mm, possibly new from the prior study. There is now consolidation of the right middle and lower lobes. Left upper lobe nodule has decreased in size. Nodule in the left lower lobe has substantially resolved. No left pleural  effusion. Musculoskeletal: There are mildly displaced fractures of the right fifth through seventh ribs. CT ABDOMEN PELVIS FINDINGS Hepatobiliary: No hepatic hematoma or laceration. No biliary dilatation. Cholelithiasis without acute inflammation. Pancreas: Normal contours without ductal dilatation. No peripancreatic fluid collection. Spleen: No splenic laceration or hematoma. Adrenals/Urinary Tract: --Adrenal glands: No adrenal hemorrhage. --Right kidney/ureter: No hydronephrosis or perinephric hematoma. --Left kidney/ureter: Upper pole cyst measures 3.6 cm. --Urinary bladder: Unremarkable. Stomach/Bowel: --Stomach/Duodenum: No hiatal hernia or other gastric abnormality. Normal duodenal course and caliber. --Small bowel: No dilatation or inflammation. --Colon: Rectosigmoid diverticulosis without acute inflammation. --Appendix: Normal. Vascular/Lymphatic: Atherosclerotic calcification is present within the non-aneurysmal abdominal aorta, without hemodynamically significant stenosis. No abdominal or pelvic lymphadenopathy. Reproductive: Brachytherapy seeds within the prostate gland. Musculoskeletal. Bilateral sacroiliac ankylosis. Other: None. IMPRESSION: 1. Mildly displaced fractures of the right fifth through seventh ribs. 2. New consolidation of the right middle and lower lobes. Etiology is uncertain. This is more posterior than the rib fractures and more opaque than typical contusion. It is more likely infectious or neoplastic. 3. Generally decreased size of multiple nodular opacities throughout the lungs. There is a single nodule in the right upper lobe that appears to have increased in size, though this may have been part of 1 of the more confluent nodular areas on the prior study. 4. Coronary artery and aortic atherosclerosis (ICD10-I70.0). Electronically Signed   By: Ulyses Jarred M.D.   On: 10/26/2018 01:04    Cardiac Studies   Echocardiogram 10/26/2018: Study Conclusions: - Left ventricle: The cavity  size was normal. Wall thickness was increased in a pattern of mild LVH. Systolic function was moderately reduced. The estimated ejection fraction was in the range of 35% to 40%. Anterior, anteroseptal, apical and inferoapical hypokinesis, and incoordinate septal motion. The study is not technically sufficient to allow evaluation of LV diastolic function. Ejection fraction (MOD, 2-plane): 40%. - Aortic valve: Mildly calcified leaflets with restriction of the right coronary cusp. There was no stenosis. There was trivial regurgitation. - Mitral valve: Calcified annulus. Mildly thickened leaflets . - Left atrium: Moderately dilated. - Tricuspid valve: There was mild regurgitation. - Pulmonary arteries: PA peak pressure: 58 mm Hg (S). - Inferior vena cava: The vessel was normal in size. The respirophasic diameter changes were in the normal range (>= 50%), consistent with normal central venous pressure.  Impressions: - Compared to a prior study in 2014, the LVEF is lower at 35-40% with regional wall motion abnormalities. _______________  Echocardiogram 08/10/2013: Study Conclusions: - Left ventricle: The cavity size was normal. Wall thickness was normal. Systolic function was normal. The estimated ejection fraction was in the range of 55% to 60%. Regional wall motion abnormalities cannot be excluded. The study is not technically sufficient to allow evaluation of LV diastolic function. - Aortic valve: Trileaflet; mildly calcified leaflets. Mild regurgitation. - Mitral valve: Calcified annulus. Mildly thickened leaflets . Mild regurgitation. - Left atrium: LA  Volume/BSA 33 ml/m2. The atrium was mildly dilated. - Right atrium: The atrium was moderately dilated. - Tricuspid valve: Structurally normal valve. Mild regurgitation. - Pulmonary arteries: PA peak pressure: 44mm Hg (S). - Inferior vena cava: The vessel was normal in size;  the respirophasic diameter changes were in the normal range (= 50%); findings are consistent with normal central venous pressure.  Patient Profile     Geoffrey Bennett is a 84 y.o. male with a history of hypertension, hyperlipidemia, type 2 diabetes, mild aortic insufficiency, and secondary degree AV block Mobtiz type 1, who is being seen today for the evaluation of atrial fibrillation at the request of Dr. Evangeline Gula.  Assessment & Plan    1. New Onset Atrial Fibrillation - Patient presented to the ED for evaluation of right rib pain after mechanical fall 2 days prior and fever. Patient found to be in atrial fibrillation upon arrival to the ED. - Patient has history of irregular rhythm related to PACs, blocked PACs, and 2nd degree AV block Mobitz type 1 on Event Monitor in 2014.  - TSH low at 0.076. - Telemetry shows patient is still in atrial fibrillation. He is currently rate controlled.  - CHADSVASC2 =  4 (HTN, T2DM, age x2). Started on Eliquis 5mg  twice daily yesterday. - Per Dr. Victorino December last office visit note, all negative chronotropic agents should be avoided given history of trifascicular block.  2. Elevated Troponin with EKG Changes - EKG showed atrial fibrillation with T wave inversions in inferolateral leads (new compared to tracing in 2014).  - I-stat troponin minimally elevated at 0.67. Repeat troponin elevated at 0.48 >> 0.38.  - Echo showed LVEF 35-40% (down from 55-60% in 2014) with anterior, anteroseptal, apical, and inferoapical hypokinesis, and incoordinate septal motion.  - Etiology possible large wrap-around LAD with stenosis vs. stress-induced cardiomyopathy. - Patient denies any ischemic symptoms. No angina or dyspnea. - Patient would prefer medical therapy at this time.  - Given patient has some renal insufficiency and we are not planning on doing any invasive testing, no CT angiogram planned at this time. - Patient's family is very eager to get patient home as  they feel like he is declining here in the hospital.   3. Cardiomyopathy with LVEF of 35-40% - Echo as above. - Possible ischemic cardiomyopathy vs. stress-induced cardiomyopathy. - Recommend restarting home Losartan as renal function improves.  - No beta-blocker given history of trifascicular block. - Will plan to repeat Echo in 6-8 weeks. If wall motion abnormality has improved at that time, likely a stress-induced cardiomyopathy.  4. Hypertension - Most recent BP soft but stable at 105/78. - Continue IV hydralazine as needed.   5. AKI - Slowly improving. - SCr 1.41 upon admission (0.95 in 2011). - SCr 1.21 today.  6. Pneumonia - Continue antibiotics per primary team.  For questions or updates, please contact [place] Please consult [email or web address] for contact info under        Signed, Darreld Mclean, PA-C  10/27/2018, 9:15 AM    I have examined the patient and reviewed assessment and plan and discussed with patient.  Agree with above as stated.  Patient feels well.  Still appears mildly confused in terms of some of the things he is saying.  He will need discharge planning and likely placement vs. Home health.  BP has been borderline.  WOuld not add back ARB nad he has had conduction disease , so beta blocker was not added.  AFib has been  auto rate controlled.   COntinue with plan for conservative treatment.  Hopefully, EF will reciver from this presumed stress induced cardiomyopathy.  He needs to avoid falls.   ARB and beta blocker can be readdressed at f/u based on BP readings.  At this point, given his age and frailty and recent kidney insult, would not start at this time.  PRN hydralazine has not been used since admission by my check of the [facility].  He will need Lasix 20 mg PO daily prn, at the time of discharge, in the event he starts to retain fluid due to LV dysfunction.  Larae Grooms

## 2018-10-27 NOTE — Evaluation (Signed)
Clinical/Bedside Swallow Evaluation Patient Details  Name: Geoffrey Bennett MRN: [id number] Date of Birth: Jun 29, 1922  Today's Date: 10/27/2018 Time: SLP Start Time (ACUTE ONLY): 1350 SLP Stop Time (ACUTE ONLY): 1410 SLP Time Calculation (min) (ACUTE ONLY): 20 min  Past Medical History:  Past Medical History:  Diagnosis Date  . CAP (community acquired pneumonia)   . Cataract   . Diabetes mellitus without complication ([place])   . Fracture of one rib, unsp side, init for clos fx   . Glaucoma   . Hypertension   . NSTEMI (non-ST elevated myocardial infarction) ([place])   . prostate ca dx'd 2008   seed implant  . Retinal detachment    of left eye with laser surgery   Past Surgical History:  Past Surgical History:  Procedure Laterality Date  . EYE SURGERY    . HERNIA REPAIR    . JOINT REPLACEMENT    . PROSTATE SURGERY     HPI:  84 year old male admitted 10/25/18 with fever and cough. Pt had a fall 2 days prior to admit, stuck (laying on his right side) between the toilet and tub for ~6 hours. PMH: DM, HTN, NSTEMI, prostate CA.  CT Abdomen = New consolidation of the right middle and lower lobes.   Assessment / Plan / Recommendation Clinical Impression  Pt seen at bedside to assess swallow and function and determine least restrictive diet. Pt's daughter is present, and reports pt is a picky eater, but has no difficulty swallowing. She also reports pt has a chronic cough, unchanged for "years". Pt oral motor strength and function appear adequate, with adequate dentition.   Pt accepted trials of thin liquid, puree, and solid consistencies. No obvious oral difficulty or residue, and no overt s/s aspiration observed on any consistency. Pt has few high risk indicators (other than advanced age), and no history of dysphagia. Daughter reports pt was found lying on his right side, which may explain the consolidation on that side.  Will continue current diet at this time, and continue to follow acutely  to assess continued diet tolerance, provide education, and check for improvement in lung status.     SLP Visit Diagnosis: Dysphagia, unspecified (R13.10)    Aspiration Risk  Mild aspiration risk    Diet Recommendation Regular;Thin liquid   Liquid Administration via: Cup;Straw Medication Administration: Whole meds with liquid Supervision: Patient able to self feed;Intermittent supervision to cue for compensatory strategies Compensations: Minimize environmental distractions;Slow rate;Small sips/bites Postural Changes: Seated upright at 90 degrees    Other  Recommendations Oral Care Recommendations: Oral care BID   Follow up Recommendations None      Frequency and Duration min 1 x/week  1 week       Prognosis Prognosis for Safe Diet Advancement: Good      Swallow Study   General Date of Onset: 10/27/18 HPI: 84 year old male admitted 10/25/18 with fever and cough. Pt had a fall 2 days prior to admit, stuck (;aying on his right side) between the toilet and tub for ~6 hours. PMH: DM, HTN, NSTEMI, prostate CA.  CT Abdomen = New consolidation of the right middle and lower lobes. Type of Study: Bedside Swallow Evaluation Previous Swallow Assessment: none Diet Prior to this Study: Regular;Thin liquids Temperature Spikes Noted: No Respiratory Status: Room air History of Recent Intubation: No Behavior/Cognition: Alert;Cooperative;Pleasant mood Oral Care Completed by SLP: No Oral Cavity - Dentition: Adequate natural dentition Vision: Functional for self-feeding Self-Feeding Abilities: Able to feed self Patient Positioning:  Upright in chair Baseline Vocal Quality: Hoarse;Low vocal intensity Volitional Cough: Strong Volitional Swallow: Able to elicit    Oral/Motor/Sensory Function Overall Oral Motor/Sensory Function: Within functional limits   Ice Chips Ice chips: Not tested   Thin Liquid Thin Liquid: Within functional limits Presentation: Straw    Nectar Thick Nectar Thick  Liquid: Not tested   Honey Thick Honey Thick Liquid: Not tested   Puree Puree: Within functional limits Presentation: Spoon;Self Fed   Solid     Solid: Within functional limits Presentation: [place] [facility] [place] Speech Language Pathologist [phone]  Shonna Chock 10/27/2018,2:21 PM

## 2018-10-27 NOTE — Plan of Care (Signed)
  Problem: Education: Goal: Knowledge of General Education information will improve Description Including pain rating scale, medication(s)/side effects and non-pharmacologic comfort measures Outcome: Progressing   Problem: Health Behavior/Discharge Planning: Goal: Ability to manage health-related needs will improve Outcome: Progressing   

## 2018-10-27 NOTE — Care Management Note (Signed)
Case Management Note  Patient Details  Name: Geoffrey Bennett MRN: [id number] Date of Birth: 04-08-1922  Subjective/Objective:   Pneumonia                Action/Plan: Patient lives alone; PCP: Deland Pretty, MD; has private insurance with Medicare; CM talked to daughter at the bedside, she has lots of Anxiety at this time; plan to return home at discharge; [place] choice offered, daughter chose [facility]; [place] with [facility] called for arrangements; DME - walker and cane at home; daughter is also working on getting Life Alert for the patient for medical emergencies at home an a personal care service for a nurses aide in the home through [employer]. CM will continue to follow for progression of care.  Expected Discharge Date:   possibly 10/29/2018               Expected Discharge Plan:  [place]  Discharge planning Services  CM Consult  Choice offered to:  Adult Children  HH Arranged:  RN, Disease Management, PT, OT, Nurse's Aide [place] Agency:  [facility]  Status of Service:  In process, will continue to follow  Sherrilyn Rist [phone] 10/27/2018, 10:54 AM

## 2018-10-27 NOTE — Progress Notes (Signed)
Physical Therapy Treatment Patient Details Name: Geoffrey Bennett MRN: [id number] DOB: 1922-09-12 Today's Date: 10/27/2018    History of Present Illness Pt is a 84 y.o. male admitted 10/25/18 with fever and cough; pt also with recent fall at home after slipping in bathroom (was down for ~6 hours). Xray revealed R rib fxs. Abdominal CT showed consolidation in right middle and lower lobes; worked up for CAP. EKG showed atrial fibrillation. PMH includes DM2, HTN, HLD, L eye blind, prostate CA.    PT Comments    Pt progressing with mobility. Able to stand with minA and amb 90' with RW and close min guard for balance; pt reports fearful of falling. Daughter present and supportive; she is currently working to make pt's home more accessible and hire aide support. SpO2 93% on RA. Will continue to follow acutely.   Follow Up Recommendations  Home health PT;Supervision/Assistance - 24 hour     Equipment Recommendations  None recommended by PT    Recommendations for Other Services       Precautions / Restrictions Precautions Precautions: Fall Restrictions Weight Bearing Restrictions: No    Mobility  Bed Mobility               General bed mobility comments: Received seated in recliner  Transfers Overall transfer level: Needs assistance Equipment used: Rolling walker (2 wheeled) Transfers: Sit to/from Stand Sit to Stand: Min assist         General transfer comment: MinA to assist trunk elevation and steady RW upon standing; performed repeated trials to reinforce correct hand placement on RW and improved eccentric control. Pt able to correct with cues  Ambulation/Gait Ambulation/Gait assistance: Min guard Gait Distance (Feet): 90 Feet Assistive device: Rolling walker (2 wheeled) Gait Pattern/deviations: Step-through pattern;Decreased stride length;Trunk flexed Gait velocity: Decreased Gait velocity interpretation: <1.31 ft/sec, indicative of household ambulator General Gait  Details: Slow, guarded ambulation with RW and min guard for balance. Pt with baseline significantly kyphotic posture. intermittent running RW wheel into objects, but able to self-correct. Reports fearful of falling and fatigue    Stairs             Wheelchair Mobility    Modified Rankin (Stroke Patients Only)       Balance Overall balance assessment: Needs assistance Sitting-balance support: No upper extremity supported;Feet supported Sitting balance-Leahy Scale: Good     Standing balance support: Bilateral upper extremity supported;During functional activity Standing balance-Leahy Scale: Poor Standing balance comment: Reliant on UE support                            Cognition Arousal/Alertness: Awake/alert Behavior During Therapy: WFL for tasks assessed/performed Overall Cognitive Status: Within Functional Limits for tasks assessed Area of Impairment: Memory;Following commands;Problem solving                     Memory: Decreased short-term memory Following Commands: Follows multi-step commands with increased time     Problem Solving: Requires verbal cues General Comments: Likely baseline cognition      Exercises      General Comments General comments (skin integrity, edema, etc.): Daughter present during session. Educ on guarding pt with gait belt during ambulation; plan to let her practice this next session. Daughter currently working on getting home aide support and making home more accessible      Pertinent Vitals/Pain Pain Assessment: Faces Pain Score: 5  Faces Pain Scale: Hurts a little bit  Pain Location: R middle ribs Pain Descriptors / Indicators: Guarding;Sore Pain Intervention(s): Monitored during session    Home Living                      Prior Function            PT Goals (current goals can now be found in the care plan section) Acute Rehab PT Goals Patient Stated Goal: Return home with support from daughter  and aides PT Goal Formulation: With patient/family Time For Goal Achievement: 11/09/18 Potential to Achieve Goals: Good Progress towards PT goals: Progressing toward goals    Frequency    Min 3X/week      PT Plan Current plan remains appropriate    Co-evaluation              AM-PAC PT "6 Clicks" Daily Activity  Outcome Measure  Difficulty turning over in bed (including adjusting bedclothes, sheets and blankets)?: Unable Difficulty moving from lying on back to sitting on the side of the bed? : Unable Difficulty sitting down on and standing up from a chair with arms (e.g., wheelchair, bedside commode, etc,.)?: Unable Help needed moving to and from a bed to chair (including a wheelchair)?: A Little Help needed walking in hospital room?: A Little Help needed climbing 3-5 steps with a railing? : A Lot 6 Click Score: 11    End of Session Equipment Utilized During Treatment: Gait belt Activity Tolerance: Patient tolerated treatment well;Patient limited by fatigue Patient left: in chair;with call bell/phone within reach;with family/visitor present Nurse Communication: Mobility status PT Visit Diagnosis: Unsteadiness on feet (R26.81);Difficulty in walking, not elsewhere classified (R26.2);History of falling (Z91.81)     Time: 1400-1416 PT Time Calculation (min) (ACUTE ONLY): 16 min  Charges:  $Gait Training: 8-22 mins                     Mabeline Caras, PT, DPT Acute Rehabilitation Services  Pager [phone] Office [place] 10/27/2018, 2:43 PM

## 2018-10-28 DIAGNOSIS — I214 Non-ST elevation (NSTEMI) myocardial infarction: Principal | ICD-10-CM

## 2018-10-28 DIAGNOSIS — N179 Acute kidney failure, unspecified: Secondary | ICD-10-CM

## 2018-10-28 DIAGNOSIS — J181 Lobar pneumonia, unspecified organism: Secondary | ICD-10-CM

## 2018-10-28 DIAGNOSIS — I10 Essential (primary) hypertension: Secondary | ICD-10-CM

## 2018-10-28 DIAGNOSIS — S2231XA Fracture of one rib, right side, initial encounter for closed fracture: Secondary | ICD-10-CM

## 2018-10-28 DIAGNOSIS — I4891 Unspecified atrial fibrillation: Secondary | ICD-10-CM

## 2018-10-28 LAB — CBC
HEMATOCRIT: 36.1 % — AB (ref 39.0–52.0)
HEMOGLOBIN: 11.3 g/dL — AB (ref 13.0–17.0)
MCH: 32.7 pg (ref 26.0–34.0)
MCHC: 31.3 g/dL (ref 30.0–36.0)
MCV: 104.3 fL — ABNORMAL HIGH (ref 80.0–100.0)
Platelets: 283 10*3/uL (ref 150–400)
RBC: 3.46 MIL/uL — AB (ref 4.22–5.81)
RDW: 13.2 % (ref 11.5–15.5)
WBC: 10.4 10*3/uL (ref 4.0–10.5)
nRBC: 0 % (ref 0.0–0.2)

## 2018-10-28 LAB — RETICULOCYTES
IMMATURE RETIC FRACT: 14.8 % (ref 2.3–15.9)
RBC.: 3.46 MIL/uL — ABNORMAL LOW (ref 4.22–5.81)
RETIC COUNT ABSOLUTE: 37.4 10*3/uL (ref 19.0–186.0)
RETIC CT PCT: 1.1 % (ref 0.4–3.1)

## 2018-10-28 LAB — LIPID PANEL
Cholesterol: 82 mg/dL (ref 0–200)
HDL: 29 mg/dL — ABNORMAL LOW (ref >40)
LDL Cholesterol: 43 mg/dL (ref 0–99)
Total CHOL/HDL Ratio: 2.8 RATIO
Triglycerides: 50 mg/dL (ref <150)
VLDL: 10 mg/dL (ref 0–40)

## 2018-10-28 LAB — GLUCOSE, CAPILLARY: Glucose-Capillary: 182 mg/dL — ABNORMAL HIGH (ref 70–99)

## 2018-10-28 LAB — BASIC METABOLIC PANEL
Anion gap: 9 (ref 5–15)
BUN: 67 mg/dL — ABNORMAL HIGH (ref 8–23)
CALCIUM: 7.9 mg/dL — AB (ref 8.9–10.3)
CO2: 23 mmol/L (ref 22–32)
Chloride: 105 mmol/L (ref 98–111)
Creatinine, Ser: 1.32 mg/dL — ABNORMAL HIGH (ref 0.61–1.24)
GFR calc non Af Amer: 45 mL/min — ABNORMAL LOW (ref >60)
GFR, EST AFRICAN AMERICAN: 52 mL/min — AB (ref >60)
Glucose, Bld: 240 mg/dL — ABNORMAL HIGH (ref 70–99)
POTASSIUM: 4.5 mmol/L (ref 3.5–5.1)
Sodium: 137 mmol/L (ref 135–145)

## 2018-10-28 LAB — FOLATE: Folate: 14.3 ng/mL (ref >5.9)

## 2018-10-28 LAB — IRON AND TIBC
IRON: 72 ug/dL (ref 45–182)
Saturation Ratios: 38 % (ref 17.9–39.5)
TIBC: 189 ug/dL — ABNORMAL LOW (ref 250–450)
UIBC: 117 ug/dL

## 2018-10-28 LAB — VITAMIN B12: Vitamin B-12: 673 pg/mL (ref 180–914)

## 2018-10-28 LAB — FERRITIN: FERRITIN: 702 ng/mL — AB (ref 24–336)

## 2018-10-28 MED ORDER — FUROSEMIDE 20 MG PO TABS: ORAL_TABLET | ORAL | 0 refills | Status: DC

## 2018-10-28 MED ORDER — APIXABAN 5 MG PO TABS: 5.0000 mg | ORAL_TABLET | Freq: Two times a day (BID) | ORAL | 0 refills | Status: DC

## 2018-10-28 MED ORDER — ASPIRIN 81 MG PO TBEC: 81.0000 mg | DELAYED_RELEASE_TABLET | Freq: Every day | ORAL | 0 refills | Status: DC

## 2018-10-28 MED ORDER — CEFUROXIME AXETIL 500 MG PO TABS: 500.0000 mg | ORAL_TABLET | Freq: Two times a day (BID) | ORAL | 0 refills | Status: AC

## 2018-10-28 NOTE — Progress Notes (Signed)
Patient is for discharge home today; transportation to be provided by on emergent ambulance [facility]; home address verified by daughter at the bedside; Rolling walker ordered for home. Mindi Slicker [facility] [phone]

## 2018-10-28 NOTE — Progress Notes (Addendum)
Progress Note  Patient Name: Geoffrey Bennett Date of Encounter: 10/28/2018  Primary Cardiologist: Sanda Klein, MD   Subjective   No significant overnight events. Patient denies any chest pain or shortness of breath.  Inpatient Medications    Scheduled Meds: . apixaban  5 mg Oral BID  . aspirin EC  81 mg Oral Daily  . atorvastatin  20 mg Oral QHS  . brimonidine  1 drop Left Eye QHS  . insulin aspart  0-9 Units Subcutaneous TID WC  . ipratropium-albuterol  3 mL Nebulization TID  . senna  1 tablet Oral Daily  . timolol  1 drop Left Eye QHS   Continuous Infusions: . sodium chloride Stopped (10/28/18 0501)  . azithromycin Stopped (10/28/18 0501)  . cefTRIAXone (ROCEPHIN)  IV Stopped (10/27/18 2100)   PRN Meds: sodium chloride, acetaminophen, albuterol, hydrALAZINE, ondansetron **OR** ondansetron (ZOFRAN) IV   Vital Signs    Vitals:   10/27/18 2007 10/28/18 0035 10/28/18 0436 10/28/18 0800  BP:  132/67 (!) 112/53   Pulse:  80 72   Resp:  18 20   Temp:  98.3 F (36.8 C)    TempSrc:  Oral    SpO2: 93% 93% 93% 95%  Weight:   83.1 kg   Height:        Intake/Output Summary (Last 24 hours) at 10/28/2018 0837 Last data filed at 10/28/2018 2297 Gross per 24 hour  Intake 700 ml  Output 304 ml  Net 396 ml   Filed Weights   10/26/18 0454 10/27/18 0414 10/28/18 0436  Weight: 85.6 kg 83.8 kg 83.1 kg    Telemetry    Atrial fibrillation with PVCs - Personally Reviewed  Physical Exam   GEN: 84 year old male sitting comfortably in chair. Alert and in no acute distress.   Neck: Supple.  Cardiac: Irregularly irregular rhythm with normal rate. No murmurs, rubs, or gallops.  Respiratory: No increased work of breathing. Clear to auscultation bilaterally. GI: Abdomen soft, nontender, non-distended. MS: Mild pitting edema of bilateral lower extremities around ankles but improving. Neuro:  No focal deficits. Psych: Normal affect.  Labs    Chemistry Recent Labs  Lab  10/25/18 1920 10/26/18 0620 10/27/18 0418 10/28/18 0518  NA 130* 132* 134* 137  K 3.5 3.7 3.9 4.5  CL 99 105 102 105  CO2 20* 22 23 23   GLUCOSE 222* 146* 175* 240*  BUN 52* 55* 58* 67*  CREATININE 1.41* 1.25* 1.21 1.32*  CALCIUM 7.9* 7.4* 7.4* 7.9*  PROT 5.7* 5.5*  --   --   ALBUMIN 2.5* 2.3*  --   --   AST 33 37  --   --   ALT 26 27  --   --   ALKPHOS 59 58  --   --   BILITOT 1.0 0.9  --   --   GFRNONAA 41* 48* 50* 45*  GFRAA 48* 55* 58* 52*  ANIONGAP 11 5 9 9      Hematology Recent Labs  Lab 10/26/18 0620 10/27/18 0418 10/28/18 0518  WBC 16.1* 14.3* 10.4  RBC 3.27* 3.41* 3.46*  3.46*  HGB 11.0* 11.4* 11.3*  HCT 33.4* 34.9* 36.1*  MCV 102.1* 102.3* 104.3*  MCH 33.6 33.4 32.7  MCHC 32.9 32.7 31.3  RDW 13.0 13.1 13.2  PLT 196 239 283    Cardiac Enzymes Recent Labs  Lab 10/25/18 2357 10/26/18 0620 10/26/18 1125  TROPONINI 0.48* 0.38* 0.34*    Recent Labs  Lab 10/25/18 1935  TROPIPOC 0.67*     BNPNo results for input(s): BNP, PROBNP in the last 168 hours.   DDimer No results for input(s): DDIMER in the last 168 hours.   Radiology    No results found.  Cardiac Studies   Echocardiogram 10/26/2018: Study Conclusions: - Left ventricle: The cavity size was normal. Wall thickness was increased in a pattern of mild LVH. Systolic function was moderately reduced. The estimated ejection fraction was in the range of 35% to 40%. Anterior, anteroseptal, apical and inferoapical hypokinesis, and incoordinate septal motion. The study is not technically sufficient to allow evaluation of LV diastolic function. Ejection fraction (MOD, 2-plane): 40%. - Aortic valve: Mildly calcified leaflets with restriction of the right coronary cusp. There was no stenosis. There was trivial regurgitation. - Mitral valve: Calcified annulus. Mildly thickened leaflets . - Left atrium: Moderately dilated. - Tricuspid valve: There was mild regurgitation. -  Pulmonary arteries: PA peak pressure: 58 mm Hg (S). - Inferior vena cava: The vessel was normal in size. The respirophasic diameter changes were in the normal range (>= 50%), consistent with normal central venous pressure.  Impressions: - Compared to a prior study in 2014, the LVEF is lower at 35-40% with regional wall motion abnormalities. _______________  Echocardiogram 08/10/2013: Study Conclusions: - Left ventricle: The cavity size was normal. Wall thickness was normal. Systolic function was normal. The estimated ejection fraction was in the range of 55% to 60%. Regional wall motion abnormalities cannot be excluded. The study is not technically sufficient to allow evaluation of LV diastolic function. - Aortic valve: Trileaflet; mildly calcified leaflets. Mild regurgitation. - Mitral valve: Calcified annulus. Mildly thickened leaflets . Mild regurgitation. - Left atrium: LA Volume/BSA 33 ml/m2. The atrium was mildly dilated. - Right atrium: The atrium was moderately dilated. - Tricuspid valve: Structurally normal valve. Mild regurgitation. - Pulmonary arteries: PA peak pressure: 52mm Hg (S). - Inferior vena cava: The vessel was normal in size; the respirophasic diameter changes were in the normal range (= 50%); findings are consistent with normal central venous pressure.  Patient Profile     Geoffrey Bennett is a 84 y.o. male with a history of hypertension, hyperlipidemia, type 2 diabetes, mild aortic insufficiency, and secondary degree AV block Mobtiz type 1, who is being seen today for the evaluation of atrial fibrillation at the request of Dr. Evangeline Gula.  Assessment & Plan    1. New Onset Atrial Fibrillation - Patient presented to the ED for evaluation of right rib pain after mechanical fall 2 days prior and fever. Patient found to be in atrial fibrillation upon arrival to the ED. - Patient has history of irregular rhythm related to PACs,  blocked PACs, and 2nd degree AV block Mobitz type 1 on Event Monitor in 2014.  - TSH low at 0.076. - Telemetry shows patient is still in atrial fibrillation.  - CHADSVASC2 =  4 (HTN, T2DM, age x2). Started on Eliquis 5mg  twice daily on 11/4. - Per Dr. Victorino December last office visit note, all negative chronotropic agents should be avoided given history of trifascicular block.  2. Elevated Troponin with EKG Changes  - EKG showed atrial fibrillation with T wave inversions in inferolateral leads (new compared to tracing in 2014).  - I-stat troponin minimally elevated at 0.67. Repeat troponin elevated at 0.48 >> 0.38.  - Echo showed LVEF 35-40% (down from 55-60% in 2014) with anterior, anteroseptal, apical, and inferoapical hypokinesis, and incoordinate septal motion.  - Etiology possible large wrap-around LAD with stenosis vs.  stress-induced cardiomyopathy. - Patient denies any ischemic symptoms. No angina or dyspnea. - Patient would prefer medical therapy at this time.  - Given patient has some renal insufficiency and we are not planning on doing any invasive testing, no CT angiogram planned at this time. - Patient's family is very eager to get patient home as they feel like he is declining here in the hospital.   3. Cardiomyopathy with LVEF of 35-40% - Echo as above. - Possible ischemic cardiomyopathy vs. stress-induced cardiomyopathy. - No beta-blocker given history of trifascicular block. - Restarting home Losartan can be readdressed at outpatient follow up.  - Dr. Irish Lack recommended prescribing Lasix 20mg  daily as needed at time of discharge in the event he starts to retain fluid due to LV dysfunction.  - Will plan to repeat Echo in 6-8 weeks. If wall motion abnormality has improved at that time, likely a stress-induced cardiomyopathy.   4. Hypertension - Most recent BP 112/53. - Restarting home Losartan can be readdressed at outpatient follow-up. BP has been soft during admission. Given  age, frailty, and recent kidney injury, will not restart at this time. - Patient has IV hydralazine ordered as needed but it does not appear that it has be used since admission according to [facility].  5. AKI - SCr 1.41 upon admission (0.95 in 2011). - SCr 1.32 today (up from 1.21 yesterday).  6. Pneumonia - Continue antibiotics per primary team.  For questions or updates, please contact [place] Please consult [email or web address] for contact info under        Signed, Darreld Mclean, PA-C  10/28/2018, 8:37 AM    I have examined the patient and reviewed assessment and plan and discussed with patient.  Agree with above as stated.  D/w Dr. Starla Link.  Holding losartan given some borderline low BPs.  Prn Lasix will be used.    Continue to be asymptomatic.  F/u with Dr. Loletha Grayer and may need repeat echo to see if LV EF improved.   I stressed the importance of avoiding falling.   Eliquis for AFib stroke prevention.  Stop aspirin.   Larae Grooms

## 2018-10-28 NOTE — Discharge Summary (Addendum)
Physician Discharge Summary  Geoffrey Bennett UEA:[id number] DOB: 1922-11-20 DOA: 10/25/2018  PCP: Deland Pretty, MD  Admit date: 10/25/2018 Discharge date: 10/28/2018  Admitted From: Home Disposition: Home  Recommendations for Outpatient Follow-up:  1. Follow up with PCP in 1 week with repeat CBC/BMP 2. Outpatient follow-up with cardiology 3. Follow-up in the ED if symptoms worsen or new appear   Home Health: Yes Equipment/Devices: None  Discharge Condition: Stable CODE STATUS: DNR Diet recommendation: Heart Healthy / Carb Modified   Brief/Interim Summary: 84 year old male with history of abdominal receptor, hypertension, hyperlipidemia, left eye blind, prostate cancer presented with fever and cough.  She was found to have a hip fracture and was admitted for concern for pneumonia.  He was started on broad-spectrum antibiotics.  He was also found to have new onset atrial fibrillation and cardiology was consulted.  He also had mildly positive troponin.  EF was found to be 35 to 40%.  He was started on Eliquis for A. fib.  No need for any further invasive cardiac work-up as per cardiology.  His condition has improved.  He is on room air with respiratory status stable.  He will be discharged home on oral antibiotics.  Discharge Diagnoses:  Principal Problem:   CAP (community acquired pneumonia) Active Problems:   DM (diabetes mellitus) ([place])   Essential hypertension   BPH (benign prostatic hyperplasia)   Multiple pulmonary nodules   NSTEMI (non-ST elevated myocardial infarction) ([place])   Closed fracture of one rib of right side   AKI (acute kidney injury) ([place])   New onset atrial fibrillation (HCC)  Community-acquired pneumonia -Treated with Rocephin and Zithromax.  Influenza PCR negative -Cultures negative so far -Respiratory status is stable and currently is on room air -Discharge home on 4 more days of Ceftin  Non-ST elevation MI -Echo showed EF of 35 to 40% with wall motion  abnormalities -Currently chest pain-free.  Continue statin.  Not on beta-blocker because of prior history of trifascicular block.  No plans for any further cardiac interventions as per cardiology.  Outpatient follow-up with cardiology  New onset atrial fibrillation -Currently rate controlled.  Continue Eliquis.  Outpatient follow-up with cardiology.  no rate controlling medications at this time.  Acute systolic CHF -Echo showed EF of 35 to 40%.  No evidence of volume overload currently.  Losartan was kept on hold on admission.  We will continue to hold losartan on discharge and if renal function remains stable as an outpatient, this can be resumed by PCP. -We will discharge on Lasix 20 mg 3 times a week as needed for fluid retention/weight gain as per cardiology recommendations.  Right hip fractures -No pneumothorax noted on imaging studies -Probably secondary to fall.  Respiratory status stable.  Acute renal failure -Creatinine was 1.41 at admission.  Unclear if the patient has chronic kidney disease -Creatinine slightly improved.  Outpatient follow-up.  Macrocytic anemia -Questionable cause.  Outpatient follow-up hemoglobin stable  Leukocytosis -Probably from pneumonia.  Resolved  History of prostate cancer with osseous metastasis -Stable.  Outpatient follow-up with Dr. Diona Fanti  Diabetes mellitus type II -Outpatient follow-up with primary care provider.  Metformin will be on hold for now    Discharge Instructions  Discharge Instructions    (HEART FAILURE PATIENTS) Call MD:  Anytime you have any of the following symptoms: 1) 3 pound weight gain in 24 hours or 5 pounds in 1 week 2) shortness of breath, with or without a dry hacking cough 3) swelling in the hands, feet  or stomach 4) if you have to sleep on extra pillows at night in order to breathe.   Complete by:  As directed    Ambulatory referral to Cardiology   Complete by:  As directed    Follow up for Afib   Call MD  for:  difficulty breathing, headache or visual disturbances   Complete by:  As directed    Call MD for:  extreme fatigue   Complete by:  As directed    Call MD for:  hives   Complete by:  As directed    Call MD for:  persistant dizziness or light-headedness   Complete by:  As directed    Call MD for:  persistant nausea and vomiting   Complete by:  As directed    Call MD for:  severe uncontrolled pain   Complete by:  As directed    Call MD for:  temperature >100.4   Complete by:  As directed    Diet - low sodium heart healthy   Complete by:  As directed    Diet Carb Modified   Complete by:  As directed    Increase activity slowly   Complete by:  As directed      Discharge Instructions   None    Allergies as of 10/28/2018   No Known Allergies     Medication List    STOP taking these medications   losartan 50 MG tablet Commonly known as:  COZAAR   metFORMIN 500 MG 24 hr tablet Commonly known as:  GLUCOPHAGE-XR     TAKE these medications   apixaban 5 MG Tabs tablet Commonly known as:  ELIQUIS Take 1 tablet (5 mg total) by mouth 2 (two) times daily.   atorvastatin 20 MG tablet Commonly known as:  LIPITOR Take 20 mg by mouth at bedtime.   brimonidine 0.1 % Soln Commonly known as:  ALPHAGAN P Place 1 drop into the left eye at bedtime.   cefUROXime 500 MG tablet Commonly known as:  CEFTIN Take 1 tablet (500 mg total) by mouth 2 (two) times daily for 4 days.   furosemide 20 MG tablet Commonly known as:  LASIX 20 mg 3 times a week as needed for fluid retention/weight gain   PRESCRIPTION MEDICATION Injection to lower testosterone levels - administered by Dr. Diona Fanti [facility] once every 4 months. Last injection October 2019   REFRESH OP Place 1 drop into both eyes at bedtime.   timolol 0.5 % ophthalmic solution Commonly known as:  TIMOPTIC Place 1 drop into the left eye at bedtime.       Follow-up Information    [facility], [facility] Follow up.    Specialty:  [place] Why:  They will do your home health care at your home Contact information: [place] [address] [phone]        Deland Pretty, MD. Schedule an appointment as soon as possible for a visit in 1 week(s).   Specialty:  Internal Medicine Why:  With repeat CBC/BMP Contact information: [place] [address] [phone]        Sanda Klein, MD .   Specialty:  Cardiology Contact information: [address] [place] [address] [phone]          No Known Allergies  Consultations:  Cardiology   Procedures/Studies: Ct Abdomen Pelvis Wo Contrast  Result Date: 10/26/2018 CLINICAL DATA:  Fall EXAM: CT CHEST, ABDOMEN AND PELVIS  WITHOUT CONTRAST TECHNIQUE: Multidetector CT imaging of the chest, abdomen and pelvis was performed following the standard protocol without IV contrast. COMPARISON:  CT chest abdomen pelvis 10/09/2018 FINDINGS: CT CHEST FINDINGS Cardiovascular: Heart size is normal without pericardial effusion. The thoracic aorta is normal in course and caliber. There is moderate calcific coronary artery and aortic atherosclerosis. Mediastinum/Nodes: No mediastinal hematoma. No mediastinal, hilar or axillary lymphadenopathy. The visualized thyroid and thoracic esophageal course are unremarkable. Lungs/Pleura: Spiculated lesion in the right upper lobe, decreased from the prior study. There is a right upper lobe nodule that measures 11 mm, possibly new from the prior study. There is now consolidation of the right middle and lower lobes. Left upper lobe nodule has decreased in size. Nodule in the left lower lobe has substantially resolved. No left pleural effusion. Musculoskeletal: There are mildly displaced fractures of the right fifth through seventh ribs. CT ABDOMEN PELVIS FINDINGS Hepatobiliary: No hepatic hematoma or laceration. No biliary dilatation. Cholelithiasis  without acute inflammation. Pancreas: Normal contours without ductal dilatation. No peripancreatic fluid collection. Spleen: No splenic laceration or hematoma. Adrenals/Urinary Tract: --Adrenal glands: No adrenal hemorrhage. --Right kidney/ureter: No hydronephrosis or perinephric hematoma. --Left kidney/ureter: Upper pole cyst measures 3.6 cm. --Urinary bladder: Unremarkable. Stomach/Bowel: --Stomach/Duodenum: No hiatal hernia or other gastric abnormality. Normal duodenal course and caliber. --Small bowel: No dilatation or inflammation. --Colon: Rectosigmoid diverticulosis without acute inflammation. --Appendix: Normal. Vascular/Lymphatic: Atherosclerotic calcification is present within the non-aneurysmal abdominal aorta, without hemodynamically significant stenosis. No abdominal or pelvic lymphadenopathy. Reproductive: Brachytherapy seeds within the prostate gland. Musculoskeletal. Bilateral sacroiliac ankylosis. Other: None. IMPRESSION: 1. Mildly displaced fractures of the right fifth through seventh ribs. 2. New consolidation of the right middle and lower lobes. Etiology is uncertain. This is more posterior than the rib fractures and more opaque than typical contusion. It is more likely infectious or neoplastic. 3. Generally decreased size of multiple nodular opacities throughout the lungs. There is a single nodule in the right upper lobe that appears to have increased in size, though this may have been part of 1 of the more confluent nodular areas on the prior study. 4. Coronary artery and aortic atherosclerosis (ICD10-I70.0). Electronically Signed   By: Ulyses Jarred M.D.   On: 10/26/2018 01:04   Dg Ribs Unilateral W/chest Right  Result Date: 10/25/2018 CLINICAL DATA:  Fall 2 days ago.  Pain to lower ribs. EXAM: RIGHT RIBS AND CHEST - 3+ VIEW COMPARISON:  Chest x-ray October 25, 2014 FINDINGS: Elevation right hemidiaphragm persists. The heart, hila, and mediastinum are unchanged. Mild opacity in the bases,  right greater than left. No overt edema. No other acute abnormalities. Apparent lateral right rib fracture at the site of pain. IMPRESSION: Right-sided rib fracture without pneumothorax. Right greater than left bibasilar opacities favored represent atelectasis rather than infiltrate. Electronically Signed   By: Dorise Bullion III M.D   On: 10/25/2018 19:48   Ct Chest Wo Contrast  Result Date: 10/26/2018 CLINICAL DATA:  Fall EXAM: CT CHEST, ABDOMEN AND PELVIS WITHOUT CONTRAST TECHNIQUE: Multidetector CT imaging of the chest, abdomen and pelvis was performed following the standard protocol without IV contrast. COMPARISON:  CT chest abdomen pelvis 10/09/2018 FINDINGS: CT CHEST FINDINGS Cardiovascular: Heart size is normal without pericardial effusion. The thoracic aorta is normal in course and caliber. There is moderate calcific coronary artery and aortic atherosclerosis. Mediastinum/Nodes: No mediastinal hematoma. No mediastinal, hilar or axillary lymphadenopathy. The visualized thyroid and thoracic esophageal course are unremarkable. Lungs/Pleura: Spiculated lesion in the right upper lobe, decreased  from the prior study. There is a right upper lobe nodule that measures 11 mm, possibly new from the prior study. There is now consolidation of the right middle and lower lobes. Left upper lobe nodule has decreased in size. Nodule in the left lower lobe has substantially resolved. No left pleural effusion. Musculoskeletal: There are mildly displaced fractures of the right fifth through seventh ribs. CT ABDOMEN PELVIS FINDINGS Hepatobiliary: No hepatic hematoma or laceration. No biliary dilatation. Cholelithiasis without acute inflammation. Pancreas: Normal contours without ductal dilatation. No peripancreatic fluid collection. Spleen: No splenic laceration or hematoma. Adrenals/Urinary Tract: --Adrenal glands: No adrenal hemorrhage. --Right kidney/ureter: No hydronephrosis or perinephric hematoma. --Left  kidney/ureter: Upper pole cyst measures 3.6 cm. --Urinary bladder: Unremarkable. Stomach/Bowel: --Stomach/Duodenum: No hiatal hernia or other gastric abnormality. Normal duodenal course and caliber. --Small bowel: No dilatation or inflammation. --Colon: Rectosigmoid diverticulosis without acute inflammation. --Appendix: Normal. Vascular/Lymphatic: Atherosclerotic calcification is present within the non-aneurysmal abdominal aorta, without hemodynamically significant stenosis. No abdominal or pelvic lymphadenopathy. Reproductive: Brachytherapy seeds within the prostate gland. Musculoskeletal. Bilateral sacroiliac ankylosis. Other: None. IMPRESSION: 1. Mildly displaced fractures of the right fifth through seventh ribs. 2. New consolidation of the right middle and lower lobes. Etiology is uncertain. This is more posterior than the rib fractures and more opaque than typical contusion. It is more likely infectious or neoplastic. 3. Generally decreased size of multiple nodular opacities throughout the lungs. There is a single nodule in the right upper lobe that appears to have increased in size, though this may have been part of 1 of the more confluent nodular areas on the prior study. 4. Coronary artery and aortic atherosclerosis (ICD10-I70.0). Electronically Signed   By: Ulyses Jarred M.D.   On: 10/26/2018 01:04   Nm Bone Scan Whole Body  Result Date: 10/09/2018 CLINICAL DATA:  Prostate cancer.  Evaluate for bone metastasis. EXAM: NUCLEAR MEDICINE WHOLE BODY BONE SCAN TECHNIQUE: Whole body anterior and posterior images were obtained approximately 3 hours after intravenous injection of radiopharmaceutical. RADIOPHARMACEUTICALS:  21.4 mCi Technetium-67m MDP IV COMPARISON:  Bone scan dated 11/27/2017. CT abdomen and pelvis dated 10/09/2018. FINDINGS: Again noted is the previously described osseous metastasis within the RIGHT acetabulum, inferior pubic ramus and proximal RIGHT femur, although perhaps slightly diminished  intensity of the radiotracer uptake at the proximal femur. No evidence of additional osseous metastasis on today's bone scan. Again noted is mild degenerative uptake about the bilateral shoulders and degenerative uptake at the sternoclavicular joint spaces. No abnormal soft tissue uptake. IMPRESSION: 1. Persistent abnormal radiotracer uptake within the RIGHT hemipelvis and proximal RIGHT femur, compatible with the previously described osseous metastases demonstrated on CT of 10/09/2018. Perhaps slightly decreased intensity of the radiotracer uptake at the proximal femur, of uncertain significance. 2. No evidence of new osseous metastasis on today's exam. Electronically Signed   By: [place]i Cabot M.D.   On: 10/09/2018 16:25    Transthoracic echocardiogram Study Conclusions  - Left ventricle: The cavity size was normal. Wall thickness was increased in a pattern of mild LVH. Systolic function was moderately reduced. The estimated ejection fraction was in the range of 35% to 40%. Anterior, anteroseptal, apical and inferoapical hypokinesis, and incoordinate septal motion. The study is not technically sufficient to allow evaluation of LV diastolic function. Ejection fraction (MOD, 2-plane): 40%. - Aortic valve: Mildly calcified leaflets with restriction of the right coronary cusp. There was no stenosis. There was trivial regurgitation. - Mitral valve: Calcified annulus. Mildly thickened leaflets . - Left atrium: Moderately dilated. -  Tricuspid valve: There was mild regurgitation. - Pulmonary arteries: PA peak pressure: 58 mm Hg (S). - Inferior vena cava: The vessel was normal in size. The respirophasic diameter changes were in the normal range (>= 50%), consistent with normal central venous pressure.  Impressions:  - Compared to a prior study in 2014, the LVEF is lower at 35-40% with regional wall motion abnormalities.   Subjective: Patient seen and examined at  bedside.  He denies any overnight fever, nausea, vomiting or worsening chest pain or shortness of breath.  Daughter present at bedside.  Discharge Exam: Vitals:   10/28/18 0800 10/28/18 0856  BP:  (!) 121/58  Pulse:  84  Resp:    Temp:    SpO2: 95% 96%   Vitals:   10/28/18 0035 10/28/18 0436 10/28/18 0800 10/28/18 0856  BP: 132/67 (!) 112/53  (!) 121/58  Pulse: 80 72  84  Resp: 18 20    Temp: 98.3 F (36.8 C)     TempSrc: Oral     SpO2: 93% 93% 95% 96%  Weight:  83.1 kg    Height:        General: Pt is alert, awake, not in acute distress Cardiovascular: rate controlled, S1/S2 + Respiratory: bilateral decreased breath sounds at bases Abdominal: Soft, NT, ND, bowel sounds + Extremities: no edema, no cyanosis    The results of significant diagnostics from this hospitalization (including imaging, microbiology, ancillary and laboratory) are listed below for reference.     Microbiology: Recent Results (from the past 240 hour(s))  Blood Culture (routine x 2)     Status: None (Preliminary result)   Collection Time: 10/25/18  7:20 PM  Result Value Ref Range Status   Specimen Description BLOOD BLOOD RIGHT FOREARM  Final   Special Requests   Final    BOTTLES DRAWN AEROBIC AND ANAEROBIC Blood Culture adequate volume   Culture   Final    NO GROWTH 3 DAYS Performed at [facility], [address] [place], [place] [address]    Report Status PENDING  Incomplete  Blood Culture (routine x 2)     Status: None (Preliminary result)   Collection Time: 10/25/18  7:36 PM  Result Value Ref Range Status   Specimen Description BLOOD BLOOD RIGHT HAND  Final   Special Requests   Final    BOTTLES DRAWN AEROBIC AND ANAEROBIC Blood Culture results may not be optimal due to an excessive volume of blood received in culture bottles   Culture   Final    NO GROWTH 3 DAYS Performed at [facility], [place] [address], [place], [place] [address]    Report Status PENDING  Incomplete   Urine culture     Status: None   Collection Time: 10/25/18  9:12 PM  Result Value Ref Range Status   Specimen Description URINE, RANDOM  Final   Special Requests NONE  Final   Culture   Final    NO GROWTH Performed at [facility], [address], [place], [place] [address]    Report Status 10/27/2018 FINAL  Final     Labs: BNP (last 3 results) No results for input(s): BNP in the last 8760 hours. Basic Metabolic Panel: Recent Labs  Lab 10/25/18 1920 10/25/18 2357 10/26/18 0620 10/27/18 0418 10/28/18 0518  NA 130*  --  132* 134* 137  K 3.5  --  3.7 3.9 4.5  CL 99  --  105 102 105  CO2 20*  --  22 23 23   GLUCOSE 222*  --  146* 175* 240*  BUN 52*  --  55* 58* 67*  CREATININE 1.41*  --  1.25* 1.21 1.32*  CALCIUM 7.9*  --  7.4* 7.4* 7.9*  MG  --  1.8  --   --   --    Liver Function Tests: Recent Labs  Lab 10/25/18 1920 10/26/18 0620  AST 33 37  ALT 26 27  ALKPHOS 59 58  BILITOT 1.0 0.9  PROT 5.7* 5.5*  ALBUMIN 2.5* 2.3*   No results for input(s): LIPASE, AMYLASE in the last 168 hours. No results for input(s): AMMONIA in the last 168 hours. CBC: Recent Labs  Lab 10/25/18 1920 10/26/18 0620 10/27/18 0418 10/28/18 0518  WBC 17.9* 16.1* 14.3* 10.4  NEUTROABS 15.6* 14.0*  --   --   HGB 11.7* 11.0* 11.4* 11.3*  HCT 36.2* 33.4* 34.9* 36.1*  MCV 102.0* 102.1* 102.3* 104.3*  PLT 202 196 239 283   Cardiac Enzymes: Recent Labs  Lab 10/25/18 1910 10/25/18 2357 10/26/18 0620 10/26/18 1125  CKTOTAL 200  --   --   --   TROPONINI  --  0.48* 0.38* 0.34*   BNP: Invalid input(s): POCBNP CBG: Recent Labs  Lab 10/27/18 0729 10/27/18 1125 10/27/18 1607 10/27/18 2117 10/28/18 0741  GLUCAP 187* 178* 173* 165* 182*   D-Dimer No results for input(s): DDIMER in the last 72 hours. Hgb A1c Recent Labs    10/26/18 0620  HGBA1C 6.2*   Lipid Profile Recent Labs    10/28/18 0518  CHOL 82  HDL 29*  LDLCALC 43  TRIG 50  CHOLHDL 2.8   Thyroid  function studies Recent Labs    10/25/18 2357  TSH 0.076*   Anemia work up Recent Labs    10/28/18 0518  VITAMINB12 673  FOLATE 14.3  FERRITIN 702*  TIBC 189*  IRON 72  RETICCTPCT 1.1   Urinalysis    Component Value Date/Time   COLORURINE AMBER (A) 10/25/2018 2112   APPEARANCEUR HAZY (A) 10/25/2018 2112   LABSPEC 1.018 10/25/2018 2112   PHURINE 5.0 10/25/2018 2112   GLUCOSEU 50 (A) 10/25/2018 2112   HGBUR NEGATIVE 10/25/2018 2112   BILIRUBINUR NEGATIVE 10/25/2018 2112   BILIRUBINUR negative 03/26/2017 0847   BILIRUBINUR neg 09/09/2014 1412   KETONESUR NEGATIVE 10/25/2018 2112   PROTEINUR 30 (A) 10/25/2018 2112   UROBILINOGEN 0.2 03/26/2017 0847   UROBILINOGEN 0.2 04/15/2010 0013   NITRITE NEGATIVE 10/25/2018 2112   LEUKOCYTESUR NEGATIVE 10/25/2018 2112   Sepsis Labs Invalid input(s): PROCALCITONIN,  WBC,  LACTICIDVEN Microbiology Recent Results (from the past 240 hour(s))  Blood Culture (routine x 2)     Status: None (Preliminary result)   Collection Time: 10/25/18  7:20 PM  Result Value Ref Range Status   Specimen Description BLOOD BLOOD RIGHT FOREARM  Final   Special Requests   Final    BOTTLES DRAWN AEROBIC AND ANAEROBIC Blood Culture adequate volume   Culture   Final    NO GROWTH 3 DAYS Performed at [facility], [place] [address], [place], [place] [address]    Report Status PENDING  Incomplete  Blood Culture (routine x 2)     Status: None (Preliminary result)   Collection Time: 10/25/18  7:36 PM  Result Value Ref Range Status   Specimen Description BLOOD BLOOD RIGHT HAND  Final   Special Requests   Final    BOTTLES DRAWN AEROBIC AND ANAEROBIC Blood Culture results may not be  optimal due to an excessive volume of blood received in culture bottles   Culture   Final    NO GROWTH 3 DAYS Performed at [facility], [place] [address], [place], [place] [address]    Report Status PENDING  Incomplete  Urine culture     Status: None   Collection Time:  10/25/18  9:12 PM  Result Value Ref Range Status   Specimen Description URINE, RANDOM  Final   Special Requests NONE  Final   Culture   Final    NO GROWTH Performed at [facility], [address], [place], [place] [address]    Report Status 10/27/2018 FINAL  Final     Time coordinating discharge: 35 minutes  SIGNED:   Aline August, MD  [facility] 10/28/2018, 9:08 AM Pager: [phone]  If 7PM-7AM, please contact night-coverage [email or web address] Password [email or web address]

## 2018-10-28 NOTE — Plan of Care (Signed)
  Problem: Education: Goal: Knowledge of General Education information will improve Description Including pain rating scale, medication(s)/side effects and non-pharmacologic comfort measures Outcome: Progressing   Problem: Clinical Measurements: Goal: Cardiovascular complication will be avoided Outcome: Progressing   Problem: Activity: Goal: Risk for activity intolerance will decrease Outcome: Progressing   Problem: Nutrition: Goal: Adequate nutrition will be maintained Outcome: Progressing   Problem: Coping: Goal: Level of anxiety will decrease Outcome: Progressing   Problem: Elimination: Goal: Will not experience complications related to bowel motility Outcome: Progressing Goal: Will not experience complications related to urinary retention Outcome: Progressing   Problem: Pain Managment: Goal: General experience of comfort will improve Outcome: Progressing   Problem: Safety: Goal: Ability to remain free from injury will improve Outcome: Progressing   Problem: Skin Integrity: Goal: Risk for impaired skin integrity will decrease Outcome: Progressing   Problem: Health Behavior/Discharge Planning: Goal: Ability to manage health-related needs will improve Outcome: Not Met (add Reason)   Problem: Clinical Measurements: Goal: Ability to maintain clinical measurements within normal limits will improve Outcome: Not Met (add Reason) Goal: Will remain free from infection Outcome: Not Met (add Reason) Goal: Diagnostic test results will improve Outcome: Not Met (add Reason) Goal: Respiratory complications will improve Outcome: Not Met (add Reason)

## 2018-10-28 NOTE — Progress Notes (Addendum)
Patient discharged: Home with family via [facility]  Via: [facility]  Discharge paperwork given: to daughter  Reviewed with teach back by Ginger, RN  IV and telemetry disconnected by SWAT nurse Ginger  Belongings given to patient

## 2018-10-28 NOTE — Discharge Instructions (Signed)

## 2018-10-30 DIAGNOSIS — L988 Other specified disorders of the skin and subcutaneous tissue: Secondary | ICD-10-CM | POA: Diagnosis not present

## 2018-10-30 DIAGNOSIS — J189 Pneumonia, unspecified organism: Secondary | ICD-10-CM | POA: Diagnosis not present

## 2018-10-30 DIAGNOSIS — I2510 Atherosclerotic heart disease of native coronary artery without angina pectoris: Secondary | ICD-10-CM | POA: Diagnosis not present

## 2018-10-30 DIAGNOSIS — N401 Enlarged prostate with lower urinary tract symptoms: Secondary | ICD-10-CM | POA: Diagnosis not present

## 2018-10-30 DIAGNOSIS — H5462 Unqualified visual loss, left eye, normal vision right eye: Secondary | ICD-10-CM | POA: Diagnosis not present

## 2018-10-30 DIAGNOSIS — N39498 Other specified urinary incontinence: Secondary | ICD-10-CM | POA: Diagnosis not present

## 2018-10-30 DIAGNOSIS — I700 Atherosclerosis of aorta: Secondary | ICD-10-CM | POA: Diagnosis not present

## 2018-10-30 DIAGNOSIS — I110 Hypertensive heart disease with heart failure: Secondary | ICD-10-CM | POA: Diagnosis not present

## 2018-10-30 DIAGNOSIS — I214 Non-ST elevation (NSTEMI) myocardial infarction: Secondary | ICD-10-CM | POA: Diagnosis not present

## 2018-10-30 DIAGNOSIS — S2241XD Multiple fractures of ribs, right side, subsequent encounter for fracture with routine healing: Secondary | ICD-10-CM | POA: Diagnosis not present

## 2018-10-30 DIAGNOSIS — I429 Cardiomyopathy, unspecified: Secondary | ICD-10-CM | POA: Diagnosis not present

## 2018-10-30 DIAGNOSIS — Z7901 Long term (current) use of anticoagulants: Secondary | ICD-10-CM | POA: Diagnosis not present

## 2018-10-30 DIAGNOSIS — I441 Atrioventricular block, second degree: Secondary | ICD-10-CM | POA: Diagnosis not present

## 2018-10-30 DIAGNOSIS — Z8546 Personal history of malignant neoplasm of prostate: Secondary | ICD-10-CM | POA: Diagnosis not present

## 2018-10-30 DIAGNOSIS — E1136 Type 2 diabetes mellitus with diabetic cataract: Secondary | ICD-10-CM | POA: Diagnosis not present

## 2018-10-30 DIAGNOSIS — I5021 Acute systolic (congestive) heart failure: Secondary | ICD-10-CM | POA: Diagnosis not present

## 2018-10-30 DIAGNOSIS — H409 Unspecified glaucoma: Secondary | ICD-10-CM | POA: Diagnosis not present

## 2018-10-30 DIAGNOSIS — I4891 Unspecified atrial fibrillation: Secondary | ICD-10-CM | POA: Diagnosis not present

## 2018-10-30 DIAGNOSIS — I498 Other specified cardiac arrhythmias: Secondary | ICD-10-CM | POA: Diagnosis not present

## 2018-10-30 DIAGNOSIS — I453 Trifascicular block: Secondary | ICD-10-CM | POA: Diagnosis not present

## 2018-10-30 DIAGNOSIS — I083 Combined rheumatic disorders of mitral, aortic and tricuspid valves: Secondary | ICD-10-CM | POA: Diagnosis not present

## 2018-10-30 LAB — CULTURE, BLOOD (ROUTINE X 2)
CULTURE: NO GROWTH
CULTURE: NO GROWTH
SPECIAL REQUESTS: ADEQUATE

## 2018-11-02 DIAGNOSIS — S2241XD Multiple fractures of ribs, right side, subsequent encounter for fracture with routine healing: Secondary | ICD-10-CM | POA: Diagnosis not present

## 2018-11-02 DIAGNOSIS — E1136 Type 2 diabetes mellitus with diabetic cataract: Secondary | ICD-10-CM | POA: Diagnosis not present

## 2018-11-02 DIAGNOSIS — J189 Pneumonia, unspecified organism: Secondary | ICD-10-CM | POA: Diagnosis not present

## 2018-11-02 DIAGNOSIS — L988 Other specified disorders of the skin and subcutaneous tissue: Secondary | ICD-10-CM | POA: Diagnosis not present

## 2018-11-02 DIAGNOSIS — I5021 Acute systolic (congestive) heart failure: Secondary | ICD-10-CM | POA: Diagnosis not present

## 2018-11-02 DIAGNOSIS — I110 Hypertensive heart disease with heart failure: Secondary | ICD-10-CM | POA: Diagnosis not present

## 2018-11-04 DIAGNOSIS — S2241XD Multiple fractures of ribs, right side, subsequent encounter for fracture with routine healing: Secondary | ICD-10-CM | POA: Diagnosis not present

## 2018-11-04 DIAGNOSIS — I5021 Acute systolic (congestive) heart failure: Secondary | ICD-10-CM | POA: Diagnosis not present

## 2018-11-04 DIAGNOSIS — E1136 Type 2 diabetes mellitus with diabetic cataract: Secondary | ICD-10-CM | POA: Diagnosis not present

## 2018-11-04 DIAGNOSIS — J189 Pneumonia, unspecified organism: Secondary | ICD-10-CM | POA: Diagnosis not present

## 2018-11-04 DIAGNOSIS — I110 Hypertensive heart disease with heart failure: Secondary | ICD-10-CM | POA: Diagnosis not present

## 2018-11-04 DIAGNOSIS — L988 Other specified disorders of the skin and subcutaneous tissue: Secondary | ICD-10-CM | POA: Diagnosis not present

## 2018-11-05 DIAGNOSIS — I110 Hypertensive heart disease with heart failure: Secondary | ICD-10-CM | POA: Diagnosis not present

## 2018-11-05 DIAGNOSIS — E1136 Type 2 diabetes mellitus with diabetic cataract: Secondary | ICD-10-CM | POA: Diagnosis not present

## 2018-11-05 DIAGNOSIS — I5021 Acute systolic (congestive) heart failure: Secondary | ICD-10-CM | POA: Diagnosis not present

## 2018-11-05 DIAGNOSIS — J189 Pneumonia, unspecified organism: Secondary | ICD-10-CM | POA: Diagnosis not present

## 2018-11-05 DIAGNOSIS — S2241XD Multiple fractures of ribs, right side, subsequent encounter for fracture with routine healing: Secondary | ICD-10-CM | POA: Diagnosis not present

## 2018-11-05 DIAGNOSIS — L988 Other specified disorders of the skin and subcutaneous tissue: Secondary | ICD-10-CM | POA: Diagnosis not present

## 2018-11-07 DIAGNOSIS — I110 Hypertensive heart disease with heart failure: Secondary | ICD-10-CM | POA: Diagnosis not present

## 2018-11-07 DIAGNOSIS — E1136 Type 2 diabetes mellitus with diabetic cataract: Secondary | ICD-10-CM | POA: Diagnosis not present

## 2018-11-07 DIAGNOSIS — I5021 Acute systolic (congestive) heart failure: Secondary | ICD-10-CM | POA: Diagnosis not present

## 2018-11-07 DIAGNOSIS — J189 Pneumonia, unspecified organism: Secondary | ICD-10-CM | POA: Diagnosis not present

## 2018-11-07 DIAGNOSIS — S2241XD Multiple fractures of ribs, right side, subsequent encounter for fracture with routine healing: Secondary | ICD-10-CM | POA: Diagnosis not present

## 2018-11-07 DIAGNOSIS — L988 Other specified disorders of the skin and subcutaneous tissue: Secondary | ICD-10-CM | POA: Diagnosis not present

## 2018-11-09 ENCOUNTER — Telehealth: Payer: Self-pay | Admitting: Cardiovascular Disease

## 2018-11-09 DIAGNOSIS — L988 Other specified disorders of the skin and subcutaneous tissue: Secondary | ICD-10-CM | POA: Diagnosis not present

## 2018-11-09 DIAGNOSIS — E1136 Type 2 diabetes mellitus with diabetic cataract: Secondary | ICD-10-CM | POA: Diagnosis not present

## 2018-11-09 DIAGNOSIS — S2241XD Multiple fractures of ribs, right side, subsequent encounter for fracture with routine healing: Secondary | ICD-10-CM | POA: Diagnosis not present

## 2018-11-09 DIAGNOSIS — I110 Hypertensive heart disease with heart failure: Secondary | ICD-10-CM | POA: Diagnosis not present

## 2018-11-09 DIAGNOSIS — J189 Pneumonia, unspecified organism: Secondary | ICD-10-CM | POA: Diagnosis not present

## 2018-11-09 DIAGNOSIS — I5021 Acute systolic (congestive) heart failure: Secondary | ICD-10-CM | POA: Diagnosis not present

## 2018-11-09 MED ORDER — ASPIRIN EC 81 MG PO TBEC: 81.0000 mg | DELAYED_RELEASE_TABLET | Freq: Every day | ORAL | Status: AC

## 2018-11-09 NOTE — Telephone Encounter (Signed)
New Message   Pt c/o medication issue:  1. Name of Medication: apixaban (ELIQUIS) 5 MG TABS tablet and aspirin  2. How are you currently taking this medication (dosage and times per day)?   3. Are you having a reaction (difficulty breathing--STAT)?  4. What is your medication issue? Patients daughter is calling because he has been having nose bleeds. 3 in the last 24hrs.  Please call to discuss.

## 2018-11-09 NOTE — Telephone Encounter (Signed)
Spoke with pt daughter Geoffrey Bennett on file. Geoffrey Bennett sts that the pt has been having reoccurring nose bleeds the last 2-3 days. Began to [employer] on ways to help manage the nose bleeds. Geoffrey Bennett sts the the pt does not  want to be on blood thinners any longer. They are aware of the pt risk for stroke.  Geoffrey Bennett was started for PAF with reluctance during the pt recent hospitalization due to the pt age and fall risk.  The pt has several co-morbidities and the nose bleeds are impeding on his ability to rehab. The pt is bedfast and unable to come to our office for office visits. Geoffrey Bennett sts that the pt is willing to take the risk of stroke and wants to stop Geoffrey Bennett. Geoffrey Bennett that I will fwd the update message to Dr.Crotioru and we will call back with his response. [facility] voiced appreciation and verbalized understanding.

## 2018-11-09 NOTE — Telephone Encounter (Signed)
Understood. When he stops the Eliquis, please have him start ASA 81 mg daily. MCr

## 2018-11-09 NOTE — Telephone Encounter (Signed)
Pt daughter Earnest Bailey aware of Dr.Croitoru's response. Holly sts that the pt is currently taking Eliquis and asa 81mg . Pt will stop Eliquis and continue Asa 56m daily.  Holly sts that the pt may decide to resume Eliquis in the future. Carey Bullocks to keep [place] updated, she verbalized understanding.

## 2018-11-10 DIAGNOSIS — E1136 Type 2 diabetes mellitus with diabetic cataract: Secondary | ICD-10-CM | POA: Diagnosis not present

## 2018-11-10 DIAGNOSIS — S2241XD Multiple fractures of ribs, right side, subsequent encounter for fracture with routine healing: Secondary | ICD-10-CM | POA: Diagnosis not present

## 2018-11-10 DIAGNOSIS — I5021 Acute systolic (congestive) heart failure: Secondary | ICD-10-CM | POA: Diagnosis not present

## 2018-11-10 DIAGNOSIS — I110 Hypertensive heart disease with heart failure: Secondary | ICD-10-CM | POA: Diagnosis not present

## 2018-11-10 DIAGNOSIS — R310 Gross hematuria: Secondary | ICD-10-CM | POA: Diagnosis not present

## 2018-11-10 DIAGNOSIS — J189 Pneumonia, unspecified organism: Secondary | ICD-10-CM | POA: Diagnosis not present

## 2018-11-10 DIAGNOSIS — L988 Other specified disorders of the skin and subcutaneous tissue: Secondary | ICD-10-CM | POA: Diagnosis not present

## 2018-11-11 ENCOUNTER — Ambulatory Visit: Payer: Medicare Other | Admitting: Physician Assistant

## 2018-11-12 DIAGNOSIS — S2241XD Multiple fractures of ribs, right side, subsequent encounter for fracture with routine healing: Secondary | ICD-10-CM | POA: Diagnosis not present

## 2018-11-12 DIAGNOSIS — L988 Other specified disorders of the skin and subcutaneous tissue: Secondary | ICD-10-CM | POA: Diagnosis not present

## 2018-11-12 DIAGNOSIS — I110 Hypertensive heart disease with heart failure: Secondary | ICD-10-CM | POA: Diagnosis not present

## 2018-11-12 DIAGNOSIS — I5021 Acute systolic (congestive) heart failure: Secondary | ICD-10-CM | POA: Diagnosis not present

## 2018-11-12 DIAGNOSIS — J189 Pneumonia, unspecified organism: Secondary | ICD-10-CM | POA: Diagnosis not present

## 2018-11-12 DIAGNOSIS — E1136 Type 2 diabetes mellitus with diabetic cataract: Secondary | ICD-10-CM | POA: Diagnosis not present

## 2018-11-13 DIAGNOSIS — I110 Hypertensive heart disease with heart failure: Secondary | ICD-10-CM | POA: Diagnosis not present

## 2018-11-13 DIAGNOSIS — I5021 Acute systolic (congestive) heart failure: Secondary | ICD-10-CM | POA: Diagnosis not present

## 2018-11-13 DIAGNOSIS — J189 Pneumonia, unspecified organism: Secondary | ICD-10-CM | POA: Diagnosis not present

## 2018-11-13 DIAGNOSIS — S2241XD Multiple fractures of ribs, right side, subsequent encounter for fracture with routine healing: Secondary | ICD-10-CM | POA: Diagnosis not present

## 2018-11-13 DIAGNOSIS — E1136 Type 2 diabetes mellitus with diabetic cataract: Secondary | ICD-10-CM | POA: Diagnosis not present

## 2018-11-13 DIAGNOSIS — L988 Other specified disorders of the skin and subcutaneous tissue: Secondary | ICD-10-CM | POA: Diagnosis not present

## 2018-11-15 DIAGNOSIS — E119 Type 2 diabetes mellitus without complications: Secondary | ICD-10-CM | POA: Diagnosis not present

## 2018-11-15 DIAGNOSIS — I10 Essential (primary) hypertension: Secondary | ICD-10-CM | POA: Diagnosis not present

## 2018-11-15 DIAGNOSIS — N401 Enlarged prostate with lower urinary tract symptoms: Secondary | ICD-10-CM | POA: Diagnosis not present

## 2018-11-15 DIAGNOSIS — D649 Anemia, unspecified: Secondary | ICD-10-CM | POA: Diagnosis not present

## 2018-11-15 DIAGNOSIS — N390 Urinary tract infection, site not specified: Secondary | ICD-10-CM | POA: Diagnosis not present

## 2018-11-15 DIAGNOSIS — I214 Non-ST elevation (NSTEMI) myocardial infarction: Secondary | ICD-10-CM | POA: Diagnosis not present

## 2018-11-15 DIAGNOSIS — C7800 Secondary malignant neoplasm of unspecified lung: Secondary | ICD-10-CM | POA: Diagnosis not present

## 2018-11-15 DIAGNOSIS — C7951 Secondary malignant neoplasm of bone: Secondary | ICD-10-CM | POA: Diagnosis not present

## 2018-11-15 DIAGNOSIS — C61 Malignant neoplasm of prostate: Secondary | ICD-10-CM | POA: Diagnosis not present

## 2018-11-15 DIAGNOSIS — E43 Unspecified severe protein-calorie malnutrition: Secondary | ICD-10-CM | POA: Diagnosis not present

## 2018-11-15 DIAGNOSIS — I4891 Unspecified atrial fibrillation: Secondary | ICD-10-CM | POA: Diagnosis not present

## 2018-11-15 DIAGNOSIS — N179 Acute kidney failure, unspecified: Secondary | ICD-10-CM | POA: Diagnosis not present

## 2018-11-15 DIAGNOSIS — E785 Hyperlipidemia, unspecified: Secondary | ICD-10-CM | POA: Diagnosis not present

## 2018-11-17 DIAGNOSIS — N390 Urinary tract infection, site not specified: Secondary | ICD-10-CM | POA: Diagnosis not present

## 2018-11-17 DIAGNOSIS — E119 Type 2 diabetes mellitus without complications: Secondary | ICD-10-CM | POA: Diagnosis not present

## 2018-11-17 DIAGNOSIS — N401 Enlarged prostate with lower urinary tract symptoms: Secondary | ICD-10-CM | POA: Diagnosis not present

## 2018-11-17 DIAGNOSIS — C7951 Secondary malignant neoplasm of bone: Secondary | ICD-10-CM | POA: Diagnosis not present

## 2018-11-17 DIAGNOSIS — C61 Malignant neoplasm of prostate: Secondary | ICD-10-CM | POA: Diagnosis not present

## 2018-11-17 DIAGNOSIS — C7800 Secondary malignant neoplasm of unspecified lung: Secondary | ICD-10-CM | POA: Diagnosis not present

## 2018-11-18 DIAGNOSIS — N390 Urinary tract infection, site not specified: Secondary | ICD-10-CM | POA: Diagnosis not present

## 2018-11-18 DIAGNOSIS — C61 Malignant neoplasm of prostate: Secondary | ICD-10-CM | POA: Diagnosis not present

## 2018-11-18 DIAGNOSIS — C7951 Secondary malignant neoplasm of bone: Secondary | ICD-10-CM | POA: Diagnosis not present

## 2018-11-18 DIAGNOSIS — C7800 Secondary malignant neoplasm of unspecified lung: Secondary | ICD-10-CM | POA: Diagnosis not present

## 2018-11-18 DIAGNOSIS — E119 Type 2 diabetes mellitus without complications: Secondary | ICD-10-CM | POA: Diagnosis not present

## 2018-11-18 DIAGNOSIS — N401 Enlarged prostate with lower urinary tract symptoms: Secondary | ICD-10-CM | POA: Diagnosis not present

## 2018-11-20 DIAGNOSIS — C7800 Secondary malignant neoplasm of unspecified lung: Secondary | ICD-10-CM | POA: Diagnosis not present

## 2018-11-20 DIAGNOSIS — N390 Urinary tract infection, site not specified: Secondary | ICD-10-CM | POA: Diagnosis not present

## 2018-11-20 DIAGNOSIS — E119 Type 2 diabetes mellitus without complications: Secondary | ICD-10-CM | POA: Diagnosis not present

## 2018-11-20 DIAGNOSIS — C61 Malignant neoplasm of prostate: Secondary | ICD-10-CM | POA: Diagnosis not present

## 2018-11-20 DIAGNOSIS — N401 Enlarged prostate with lower urinary tract symptoms: Secondary | ICD-10-CM | POA: Diagnosis not present

## 2018-11-20 DIAGNOSIS — C7951 Secondary malignant neoplasm of bone: Secondary | ICD-10-CM | POA: Diagnosis not present

## 2018-11-22 DIAGNOSIS — E43 Unspecified severe protein-calorie malnutrition: Secondary | ICD-10-CM | POA: Diagnosis not present

## 2018-11-22 DIAGNOSIS — I214 Non-ST elevation (NSTEMI) myocardial infarction: Secondary | ICD-10-CM | POA: Diagnosis not present

## 2018-11-22 DIAGNOSIS — C61 Malignant neoplasm of prostate: Secondary | ICD-10-CM | POA: Diagnosis not present

## 2018-11-22 DIAGNOSIS — I10 Essential (primary) hypertension: Secondary | ICD-10-CM | POA: Diagnosis not present

## 2018-11-22 DIAGNOSIS — E119 Type 2 diabetes mellitus without complications: Secondary | ICD-10-CM | POA: Diagnosis not present

## 2018-11-22 DIAGNOSIS — N390 Urinary tract infection, site not specified: Secondary | ICD-10-CM | POA: Diagnosis not present

## 2018-11-22 DIAGNOSIS — C7951 Secondary malignant neoplasm of bone: Secondary | ICD-10-CM | POA: Diagnosis not present

## 2018-11-22 DIAGNOSIS — N401 Enlarged prostate with lower urinary tract symptoms: Secondary | ICD-10-CM | POA: Diagnosis not present

## 2018-11-22 DIAGNOSIS — C7800 Secondary malignant neoplasm of unspecified lung: Secondary | ICD-10-CM | POA: Diagnosis not present

## 2018-11-22 DIAGNOSIS — N179 Acute kidney failure, unspecified: Secondary | ICD-10-CM | POA: Diagnosis not present

## 2018-11-22 DIAGNOSIS — D649 Anemia, unspecified: Secondary | ICD-10-CM | POA: Diagnosis not present

## 2018-11-22 DIAGNOSIS — E785 Hyperlipidemia, unspecified: Secondary | ICD-10-CM | POA: Diagnosis not present

## 2018-11-22 DIAGNOSIS — I4891 Unspecified atrial fibrillation: Secondary | ICD-10-CM | POA: Diagnosis not present

## 2018-11-23 DIAGNOSIS — E119 Type 2 diabetes mellitus without complications: Secondary | ICD-10-CM | POA: Diagnosis not present

## 2018-11-23 DIAGNOSIS — C7951 Secondary malignant neoplasm of bone: Secondary | ICD-10-CM | POA: Diagnosis not present

## 2018-11-23 DIAGNOSIS — N390 Urinary tract infection, site not specified: Secondary | ICD-10-CM | POA: Diagnosis not present

## 2018-11-23 DIAGNOSIS — C7800 Secondary malignant neoplasm of unspecified lung: Secondary | ICD-10-CM | POA: Diagnosis not present

## 2018-11-23 DIAGNOSIS — N401 Enlarged prostate with lower urinary tract symptoms: Secondary | ICD-10-CM | POA: Diagnosis not present

## 2018-11-23 DIAGNOSIS — C61 Malignant neoplasm of prostate: Secondary | ICD-10-CM | POA: Diagnosis not present

## 2018-11-24 ENCOUNTER — Telehealth: Payer: Self-pay | Admitting: Cardiovascular Disease

## 2018-11-24 MED ORDER — FUROSEMIDE 40 MG PO TABS: 40.0000 mg | ORAL_TABLET | Freq: Two times a day (BID) | ORAL | 6 refills | Status: DC

## 2018-11-24 MED ORDER — SPIRONOLACTONE 25 MG PO TABS: 25.0000 mg | ORAL_TABLET | Freq: Every day | ORAL | 6 refills | Status: AC

## 2018-11-24 NOTE — Telephone Encounter (Signed)
Information given to  [facility]  orders received e -sent to pharmacy

## 2018-11-24 NOTE — Telephone Encounter (Signed)
New Message           Maura with [facility] is calling patient is a new hospice patient.     Pt c/o swelling: STAT is pt has developed SOB within 24 hours  1) How much weight have you gained and in what time span? 6 pds 6 days  2) If swelling, where is the swelling located? Bilateral lower extremities 4 plus endemia with weeping  3) Are you currently taking a fluid pill? Yes Lasix  4) Are you currently SOB? Yes with ammoniating   5) Do you have a log of your daily weights (if so, list)? Yes  193/192 No weight loss with the doubling of the lasix  6) Have you gained 3 pounds in a day or 5 pounds in a week? Yes  7) Have you traveled recently? No             Maura with hospice states she used their physician to double up on the lasix no change please advise.

## 2018-11-24 NOTE — Telephone Encounter (Signed)
Increase furosemide to 40 mg twice daily Spironolactone 25 mg once daily, please.

## 2018-11-24 NOTE — Telephone Encounter (Signed)
Spoke to daughter -- instruction given verbalized understanding

## 2018-11-24 NOTE — Telephone Encounter (Signed)
Spoke Hospice nurse - Maura RN states the patient has gain 6 lbs in 6 days since being home. Beginning weight  186 lbs now 192 lbs. Per  Nurse -  edema "hard"toes to above knees, especially left side with weeping " Dime size area"  Patient has been taking lasix 20 mg daily. Per nurse,unable to reach office on Friday, received  Orders from hospice doctor- increase  Lasix to 40 mg daily until nurse can contact office. Cristela Blue states she saw patient late last  Afternoon no changes in weight  And still have edema with weeping. Nurse states patient has been on increase dose for 4 days.  Aware wil defer to Dr Lynford Humphrey contact nurse and patient 's caregiver Earnest Bailey -daughter

## 2018-11-25 DIAGNOSIS — I110 Hypertensive heart disease with heart failure: Secondary | ICD-10-CM | POA: Diagnosis not present

## 2018-11-25 DIAGNOSIS — E119 Type 2 diabetes mellitus without complications: Secondary | ICD-10-CM | POA: Diagnosis not present

## 2018-11-25 DIAGNOSIS — J189 Pneumonia, unspecified organism: Secondary | ICD-10-CM | POA: Diagnosis not present

## 2018-11-25 DIAGNOSIS — N390 Urinary tract infection, site not specified: Secondary | ICD-10-CM | POA: Diagnosis not present

## 2018-11-25 DIAGNOSIS — L988 Other specified disorders of the skin and subcutaneous tissue: Secondary | ICD-10-CM | POA: Diagnosis not present

## 2018-11-25 DIAGNOSIS — C61 Malignant neoplasm of prostate: Secondary | ICD-10-CM | POA: Diagnosis not present

## 2018-11-25 DIAGNOSIS — S2241XD Multiple fractures of ribs, right side, subsequent encounter for fracture with routine healing: Secondary | ICD-10-CM | POA: Diagnosis not present

## 2018-11-25 DIAGNOSIS — C7800 Secondary malignant neoplasm of unspecified lung: Secondary | ICD-10-CM | POA: Diagnosis not present

## 2018-11-25 DIAGNOSIS — C7951 Secondary malignant neoplasm of bone: Secondary | ICD-10-CM | POA: Diagnosis not present

## 2018-11-25 DIAGNOSIS — N401 Enlarged prostate with lower urinary tract symptoms: Secondary | ICD-10-CM | POA: Diagnosis not present

## 2018-11-27 DIAGNOSIS — C7800 Secondary malignant neoplasm of unspecified lung: Secondary | ICD-10-CM | POA: Diagnosis not present

## 2018-11-27 DIAGNOSIS — C7951 Secondary malignant neoplasm of bone: Secondary | ICD-10-CM | POA: Diagnosis not present

## 2018-11-27 DIAGNOSIS — E119 Type 2 diabetes mellitus without complications: Secondary | ICD-10-CM | POA: Diagnosis not present

## 2018-11-27 DIAGNOSIS — C61 Malignant neoplasm of prostate: Secondary | ICD-10-CM | POA: Diagnosis not present

## 2018-11-27 DIAGNOSIS — N390 Urinary tract infection, site not specified: Secondary | ICD-10-CM | POA: Diagnosis not present

## 2018-11-27 DIAGNOSIS — N401 Enlarged prostate with lower urinary tract symptoms: Secondary | ICD-10-CM | POA: Diagnosis not present

## 2018-12-01 DIAGNOSIS — N401 Enlarged prostate with lower urinary tract symptoms: Secondary | ICD-10-CM | POA: Diagnosis not present

## 2018-12-01 DIAGNOSIS — C7800 Secondary malignant neoplasm of unspecified lung: Secondary | ICD-10-CM | POA: Diagnosis not present

## 2018-12-01 DIAGNOSIS — N390 Urinary tract infection, site not specified: Secondary | ICD-10-CM | POA: Diagnosis not present

## 2018-12-01 DIAGNOSIS — C7951 Secondary malignant neoplasm of bone: Secondary | ICD-10-CM | POA: Diagnosis not present

## 2018-12-01 DIAGNOSIS — E119 Type 2 diabetes mellitus without complications: Secondary | ICD-10-CM | POA: Diagnosis not present

## 2018-12-01 DIAGNOSIS — C61 Malignant neoplasm of prostate: Secondary | ICD-10-CM | POA: Diagnosis not present

## 2018-12-02 DIAGNOSIS — C7800 Secondary malignant neoplasm of unspecified lung: Secondary | ICD-10-CM | POA: Diagnosis not present

## 2018-12-02 DIAGNOSIS — C61 Malignant neoplasm of prostate: Secondary | ICD-10-CM | POA: Diagnosis not present

## 2018-12-02 DIAGNOSIS — N390 Urinary tract infection, site not specified: Secondary | ICD-10-CM | POA: Diagnosis not present

## 2018-12-02 DIAGNOSIS — C7951 Secondary malignant neoplasm of bone: Secondary | ICD-10-CM | POA: Diagnosis not present

## 2018-12-02 DIAGNOSIS — E119 Type 2 diabetes mellitus without complications: Secondary | ICD-10-CM | POA: Diagnosis not present

## 2018-12-02 DIAGNOSIS — N401 Enlarged prostate with lower urinary tract symptoms: Secondary | ICD-10-CM | POA: Diagnosis not present

## 2018-12-04 DIAGNOSIS — N390 Urinary tract infection, site not specified: Secondary | ICD-10-CM | POA: Diagnosis not present

## 2018-12-04 DIAGNOSIS — C61 Malignant neoplasm of prostate: Secondary | ICD-10-CM | POA: Diagnosis not present

## 2018-12-04 DIAGNOSIS — N401 Enlarged prostate with lower urinary tract symptoms: Secondary | ICD-10-CM | POA: Diagnosis not present

## 2018-12-04 DIAGNOSIS — C7800 Secondary malignant neoplasm of unspecified lung: Secondary | ICD-10-CM | POA: Diagnosis not present

## 2018-12-04 DIAGNOSIS — C7951 Secondary malignant neoplasm of bone: Secondary | ICD-10-CM | POA: Diagnosis not present

## 2018-12-04 DIAGNOSIS — E119 Type 2 diabetes mellitus without complications: Secondary | ICD-10-CM | POA: Diagnosis not present

## 2018-12-08 DIAGNOSIS — C7800 Secondary malignant neoplasm of unspecified lung: Secondary | ICD-10-CM | POA: Diagnosis not present

## 2018-12-08 DIAGNOSIS — C61 Malignant neoplasm of prostate: Secondary | ICD-10-CM | POA: Diagnosis not present

## 2018-12-08 DIAGNOSIS — E119 Type 2 diabetes mellitus without complications: Secondary | ICD-10-CM | POA: Diagnosis not present

## 2018-12-08 DIAGNOSIS — N390 Urinary tract infection, site not specified: Secondary | ICD-10-CM | POA: Diagnosis not present

## 2018-12-08 DIAGNOSIS — C7951 Secondary malignant neoplasm of bone: Secondary | ICD-10-CM | POA: Diagnosis not present

## 2018-12-08 DIAGNOSIS — N401 Enlarged prostate with lower urinary tract symptoms: Secondary | ICD-10-CM | POA: Diagnosis not present

## 2018-12-11 DIAGNOSIS — C61 Malignant neoplasm of prostate: Secondary | ICD-10-CM | POA: Diagnosis not present

## 2018-12-11 DIAGNOSIS — E119 Type 2 diabetes mellitus without complications: Secondary | ICD-10-CM | POA: Diagnosis not present

## 2018-12-11 DIAGNOSIS — N401 Enlarged prostate with lower urinary tract symptoms: Secondary | ICD-10-CM | POA: Diagnosis not present

## 2018-12-11 DIAGNOSIS — C7800 Secondary malignant neoplasm of unspecified lung: Secondary | ICD-10-CM | POA: Diagnosis not present

## 2018-12-11 DIAGNOSIS — N390 Urinary tract infection, site not specified: Secondary | ICD-10-CM | POA: Diagnosis not present

## 2018-12-11 DIAGNOSIS — C7951 Secondary malignant neoplasm of bone: Secondary | ICD-10-CM | POA: Diagnosis not present

## 2018-12-14 DIAGNOSIS — C61 Malignant neoplasm of prostate: Secondary | ICD-10-CM | POA: Diagnosis not present

## 2018-12-14 DIAGNOSIS — C7951 Secondary malignant neoplasm of bone: Secondary | ICD-10-CM | POA: Diagnosis not present

## 2018-12-14 DIAGNOSIS — E119 Type 2 diabetes mellitus without complications: Secondary | ICD-10-CM | POA: Diagnosis not present

## 2018-12-14 DIAGNOSIS — N401 Enlarged prostate with lower urinary tract symptoms: Secondary | ICD-10-CM | POA: Diagnosis not present

## 2018-12-14 DIAGNOSIS — N390 Urinary tract infection, site not specified: Secondary | ICD-10-CM | POA: Diagnosis not present

## 2018-12-14 DIAGNOSIS — C7800 Secondary malignant neoplasm of unspecified lung: Secondary | ICD-10-CM | POA: Diagnosis not present

## 2018-12-18 DIAGNOSIS — N390 Urinary tract infection, site not specified: Secondary | ICD-10-CM | POA: Diagnosis not present

## 2018-12-18 DIAGNOSIS — N401 Enlarged prostate with lower urinary tract symptoms: Secondary | ICD-10-CM | POA: Diagnosis not present

## 2018-12-18 DIAGNOSIS — C7951 Secondary malignant neoplasm of bone: Secondary | ICD-10-CM | POA: Diagnosis not present

## 2018-12-18 DIAGNOSIS — C61 Malignant neoplasm of prostate: Secondary | ICD-10-CM | POA: Diagnosis not present

## 2018-12-18 DIAGNOSIS — C7800 Secondary malignant neoplasm of unspecified lung: Secondary | ICD-10-CM | POA: Diagnosis not present

## 2018-12-18 DIAGNOSIS — E119 Type 2 diabetes mellitus without complications: Secondary | ICD-10-CM | POA: Diagnosis not present

## 2018-12-21 DIAGNOSIS — E119 Type 2 diabetes mellitus without complications: Secondary | ICD-10-CM | POA: Diagnosis not present

## 2018-12-21 DIAGNOSIS — C61 Malignant neoplasm of prostate: Secondary | ICD-10-CM | POA: Diagnosis not present

## 2018-12-21 DIAGNOSIS — C7951 Secondary malignant neoplasm of bone: Secondary | ICD-10-CM | POA: Diagnosis not present

## 2018-12-21 DIAGNOSIS — N401 Enlarged prostate with lower urinary tract symptoms: Secondary | ICD-10-CM | POA: Diagnosis not present

## 2018-12-21 DIAGNOSIS — N390 Urinary tract infection, site not specified: Secondary | ICD-10-CM | POA: Diagnosis not present

## 2018-12-21 DIAGNOSIS — C7800 Secondary malignant neoplasm of unspecified lung: Secondary | ICD-10-CM | POA: Diagnosis not present

## 2018-12-23 DIAGNOSIS — E119 Type 2 diabetes mellitus without complications: Secondary | ICD-10-CM | POA: Diagnosis not present

## 2018-12-23 DIAGNOSIS — I214 Non-ST elevation (NSTEMI) myocardial infarction: Secondary | ICD-10-CM | POA: Diagnosis not present

## 2018-12-23 DIAGNOSIS — I4891 Unspecified atrial fibrillation: Secondary | ICD-10-CM | POA: Diagnosis not present

## 2018-12-23 DIAGNOSIS — E785 Hyperlipidemia, unspecified: Secondary | ICD-10-CM | POA: Diagnosis not present

## 2018-12-23 DIAGNOSIS — N179 Acute kidney failure, unspecified: Secondary | ICD-10-CM | POA: Diagnosis not present

## 2018-12-23 DIAGNOSIS — C7951 Secondary malignant neoplasm of bone: Secondary | ICD-10-CM | POA: Diagnosis not present

## 2018-12-23 DIAGNOSIS — I10 Essential (primary) hypertension: Secondary | ICD-10-CM | POA: Diagnosis not present

## 2018-12-23 DIAGNOSIS — E43 Unspecified severe protein-calorie malnutrition: Secondary | ICD-10-CM | POA: Diagnosis not present

## 2018-12-23 DIAGNOSIS — N401 Enlarged prostate with lower urinary tract symptoms: Secondary | ICD-10-CM | POA: Diagnosis not present

## 2018-12-23 DIAGNOSIS — D649 Anemia, unspecified: Secondary | ICD-10-CM | POA: Diagnosis not present

## 2018-12-23 DIAGNOSIS — C61 Malignant neoplasm of prostate: Secondary | ICD-10-CM | POA: Diagnosis not present

## 2018-12-23 DIAGNOSIS — N390 Urinary tract infection, site not specified: Secondary | ICD-10-CM | POA: Diagnosis not present

## 2018-12-23 DIAGNOSIS — C7800 Secondary malignant neoplasm of unspecified lung: Secondary | ICD-10-CM | POA: Diagnosis not present

## 2018-12-24 ENCOUNTER — Telehealth: Payer: Self-pay | Admitting: Cardiovascular Disease

## 2018-12-24 DIAGNOSIS — C61 Malignant neoplasm of prostate: Secondary | ICD-10-CM | POA: Diagnosis not present

## 2018-12-24 DIAGNOSIS — C7800 Secondary malignant neoplasm of unspecified lung: Secondary | ICD-10-CM | POA: Diagnosis not present

## 2018-12-24 DIAGNOSIS — E119 Type 2 diabetes mellitus without complications: Secondary | ICD-10-CM | POA: Diagnosis not present

## 2018-12-24 DIAGNOSIS — C7951 Secondary malignant neoplasm of bone: Secondary | ICD-10-CM | POA: Diagnosis not present

## 2018-12-24 DIAGNOSIS — N401 Enlarged prostate with lower urinary tract symptoms: Secondary | ICD-10-CM | POA: Diagnosis not present

## 2018-12-24 DIAGNOSIS — N390 Urinary tract infection, site not specified: Secondary | ICD-10-CM | POA: Diagnosis not present

## 2018-12-24 NOTE — Telephone Encounter (Signed)
Called patient to schedule appt with Dr. Sallyanne Kuster.  Daughter, Earnest Bailey, answered and said she would not be able to bring him to an appointment as he is wheelchair bound, on Hospice, and she would need an ambulance to get him to the appointment.  Sent a message to Dr. Sallyanne Kuster regarding this.

## 2018-12-29 DIAGNOSIS — N390 Urinary tract infection, site not specified: Secondary | ICD-10-CM | POA: Diagnosis not present

## 2018-12-29 DIAGNOSIS — C7800 Secondary malignant neoplasm of unspecified lung: Secondary | ICD-10-CM | POA: Diagnosis not present

## 2018-12-29 DIAGNOSIS — N401 Enlarged prostate with lower urinary tract symptoms: Secondary | ICD-10-CM | POA: Diagnosis not present

## 2018-12-29 DIAGNOSIS — C61 Malignant neoplasm of prostate: Secondary | ICD-10-CM | POA: Diagnosis not present

## 2018-12-29 DIAGNOSIS — C7951 Secondary malignant neoplasm of bone: Secondary | ICD-10-CM | POA: Diagnosis not present

## 2018-12-29 DIAGNOSIS — E119 Type 2 diabetes mellitus without complications: Secondary | ICD-10-CM | POA: Diagnosis not present

## 2019-01-05 DIAGNOSIS — E119 Type 2 diabetes mellitus without complications: Secondary | ICD-10-CM | POA: Diagnosis not present

## 2019-01-05 DIAGNOSIS — N401 Enlarged prostate with lower urinary tract symptoms: Secondary | ICD-10-CM | POA: Diagnosis not present

## 2019-01-05 DIAGNOSIS — C7800 Secondary malignant neoplasm of unspecified lung: Secondary | ICD-10-CM | POA: Diagnosis not present

## 2019-01-05 DIAGNOSIS — C7951 Secondary malignant neoplasm of bone: Secondary | ICD-10-CM | POA: Diagnosis not present

## 2019-01-05 DIAGNOSIS — N390 Urinary tract infection, site not specified: Secondary | ICD-10-CM | POA: Diagnosis not present

## 2019-01-05 DIAGNOSIS — C61 Malignant neoplasm of prostate: Secondary | ICD-10-CM | POA: Diagnosis not present

## 2019-01-06 ENCOUNTER — Telehealth: Payer: Self-pay | Admitting: Cardiovascular Disease

## 2019-01-06 NOTE — Telephone Encounter (Signed)
° ° °  Maura calling from [facility] care, please call [phone]  1) How much weight have you gained and in what time span? n/a  2) If swelling, where is the swelling located?  Stomach, legs  3) Are you currently taking a fluid pill? yes  4) Are you currently SOB? yes  5) Do you have a log of your daily weights (if so, list)? 180  6) Have you gained 3 pounds in a day or 5 pounds in a week?  7) Have you traveled recently? No

## 2019-01-06 NOTE — Telephone Encounter (Signed)
Spoke with Cristela Blue from [facility] who states for the past couple of days pt has been experiencing SOB, poor appetite, and swelling. She reports the lower extremity edema has decreased but feels it has shifted to pt's abdomen. She states its firm and a watermelon size. Reported weights are as followed;  12/27-190 1/7-180 1/14-184 1/15-180  Per Dr. Warren Lacy (DOD), pt is take an extra morning dose of lasix for the next 3 days and call if symptoms doesn't resolve. Maura updated and verbalizes understanding.

## 2019-01-08 DIAGNOSIS — N401 Enlarged prostate with lower urinary tract symptoms: Secondary | ICD-10-CM | POA: Diagnosis not present

## 2019-01-08 DIAGNOSIS — C7951 Secondary malignant neoplasm of bone: Secondary | ICD-10-CM | POA: Diagnosis not present

## 2019-01-08 DIAGNOSIS — C61 Malignant neoplasm of prostate: Secondary | ICD-10-CM | POA: Diagnosis not present

## 2019-01-08 DIAGNOSIS — E119 Type 2 diabetes mellitus without complications: Secondary | ICD-10-CM | POA: Diagnosis not present

## 2019-01-08 DIAGNOSIS — C7800 Secondary malignant neoplasm of unspecified lung: Secondary | ICD-10-CM | POA: Diagnosis not present

## 2019-01-08 DIAGNOSIS — N390 Urinary tract infection, site not specified: Secondary | ICD-10-CM | POA: Diagnosis not present

## 2019-01-12 DIAGNOSIS — C7800 Secondary malignant neoplasm of unspecified lung: Secondary | ICD-10-CM | POA: Diagnosis not present

## 2019-01-12 DIAGNOSIS — N390 Urinary tract infection, site not specified: Secondary | ICD-10-CM | POA: Diagnosis not present

## 2019-01-12 DIAGNOSIS — C7951 Secondary malignant neoplasm of bone: Secondary | ICD-10-CM | POA: Diagnosis not present

## 2019-01-12 DIAGNOSIS — N401 Enlarged prostate with lower urinary tract symptoms: Secondary | ICD-10-CM | POA: Diagnosis not present

## 2019-01-12 DIAGNOSIS — C61 Malignant neoplasm of prostate: Secondary | ICD-10-CM | POA: Diagnosis not present

## 2019-01-12 DIAGNOSIS — E119 Type 2 diabetes mellitus without complications: Secondary | ICD-10-CM | POA: Diagnosis not present

## 2019-01-18 DIAGNOSIS — C61 Malignant neoplasm of prostate: Secondary | ICD-10-CM | POA: Diagnosis not present

## 2019-01-18 DIAGNOSIS — N401 Enlarged prostate with lower urinary tract symptoms: Secondary | ICD-10-CM | POA: Diagnosis not present

## 2019-01-18 DIAGNOSIS — C7951 Secondary malignant neoplasm of bone: Secondary | ICD-10-CM | POA: Diagnosis not present

## 2019-01-18 DIAGNOSIS — E119 Type 2 diabetes mellitus without complications: Secondary | ICD-10-CM | POA: Diagnosis not present

## 2019-01-18 DIAGNOSIS — N390 Urinary tract infection, site not specified: Secondary | ICD-10-CM | POA: Diagnosis not present

## 2019-01-18 DIAGNOSIS — C7800 Secondary malignant neoplasm of unspecified lung: Secondary | ICD-10-CM | POA: Diagnosis not present

## 2019-01-23 DIAGNOSIS — E785 Hyperlipidemia, unspecified: Secondary | ICD-10-CM | POA: Diagnosis not present

## 2019-01-23 DIAGNOSIS — N179 Acute kidney failure, unspecified: Secondary | ICD-10-CM | POA: Diagnosis not present

## 2019-01-23 DIAGNOSIS — C61 Malignant neoplasm of prostate: Secondary | ICD-10-CM | POA: Diagnosis not present

## 2019-01-23 DIAGNOSIS — N401 Enlarged prostate with lower urinary tract symptoms: Secondary | ICD-10-CM | POA: Diagnosis not present

## 2019-01-23 DIAGNOSIS — N390 Urinary tract infection, site not specified: Secondary | ICD-10-CM | POA: Diagnosis not present

## 2019-01-23 DIAGNOSIS — I214 Non-ST elevation (NSTEMI) myocardial infarction: Secondary | ICD-10-CM | POA: Diagnosis not present

## 2019-01-23 DIAGNOSIS — E119 Type 2 diabetes mellitus without complications: Secondary | ICD-10-CM | POA: Diagnosis not present

## 2019-01-23 DIAGNOSIS — I10 Essential (primary) hypertension: Secondary | ICD-10-CM | POA: Diagnosis not present

## 2019-01-23 DIAGNOSIS — D649 Anemia, unspecified: Secondary | ICD-10-CM | POA: Diagnosis not present

## 2019-01-23 DIAGNOSIS — C7800 Secondary malignant neoplasm of unspecified lung: Secondary | ICD-10-CM | POA: Diagnosis not present

## 2019-01-23 DIAGNOSIS — C7951 Secondary malignant neoplasm of bone: Secondary | ICD-10-CM | POA: Diagnosis not present

## 2019-01-23 DIAGNOSIS — E43 Unspecified severe protein-calorie malnutrition: Secondary | ICD-10-CM | POA: Diagnosis not present

## 2019-01-23 DIAGNOSIS — I4891 Unspecified atrial fibrillation: Secondary | ICD-10-CM | POA: Diagnosis not present

## 2019-01-26 DIAGNOSIS — N390 Urinary tract infection, site not specified: Secondary | ICD-10-CM | POA: Diagnosis not present

## 2019-01-26 DIAGNOSIS — N401 Enlarged prostate with lower urinary tract symptoms: Secondary | ICD-10-CM | POA: Diagnosis not present

## 2019-01-26 DIAGNOSIS — C7800 Secondary malignant neoplasm of unspecified lung: Secondary | ICD-10-CM | POA: Diagnosis not present

## 2019-01-26 DIAGNOSIS — C7951 Secondary malignant neoplasm of bone: Secondary | ICD-10-CM | POA: Diagnosis not present

## 2019-01-26 DIAGNOSIS — C61 Malignant neoplasm of prostate: Secondary | ICD-10-CM | POA: Diagnosis not present

## 2019-01-26 DIAGNOSIS — E119 Type 2 diabetes mellitus without complications: Secondary | ICD-10-CM | POA: Diagnosis not present

## 2019-02-02 DIAGNOSIS — C61 Malignant neoplasm of prostate: Secondary | ICD-10-CM | POA: Diagnosis not present

## 2019-02-02 DIAGNOSIS — C7800 Secondary malignant neoplasm of unspecified lung: Secondary | ICD-10-CM | POA: Diagnosis not present

## 2019-02-02 DIAGNOSIS — C7951 Secondary malignant neoplasm of bone: Secondary | ICD-10-CM | POA: Diagnosis not present

## 2019-02-02 DIAGNOSIS — N401 Enlarged prostate with lower urinary tract symptoms: Secondary | ICD-10-CM | POA: Diagnosis not present

## 2019-02-02 DIAGNOSIS — N390 Urinary tract infection, site not specified: Secondary | ICD-10-CM | POA: Diagnosis not present

## 2019-02-02 DIAGNOSIS — E119 Type 2 diabetes mellitus without complications: Secondary | ICD-10-CM | POA: Diagnosis not present

## 2019-02-09 DIAGNOSIS — N401 Enlarged prostate with lower urinary tract symptoms: Secondary | ICD-10-CM | POA: Diagnosis not present

## 2019-02-09 DIAGNOSIS — E119 Type 2 diabetes mellitus without complications: Secondary | ICD-10-CM | POA: Diagnosis not present

## 2019-02-09 DIAGNOSIS — N390 Urinary tract infection, site not specified: Secondary | ICD-10-CM | POA: Diagnosis not present

## 2019-02-09 DIAGNOSIS — C61 Malignant neoplasm of prostate: Secondary | ICD-10-CM | POA: Diagnosis not present

## 2019-02-09 DIAGNOSIS — C7800 Secondary malignant neoplasm of unspecified lung: Secondary | ICD-10-CM | POA: Diagnosis not present

## 2019-02-09 DIAGNOSIS — C7951 Secondary malignant neoplasm of bone: Secondary | ICD-10-CM | POA: Diagnosis not present

## 2019-02-10 DIAGNOSIS — E119 Type 2 diabetes mellitus without complications: Secondary | ICD-10-CM | POA: Diagnosis not present

## 2019-02-10 DIAGNOSIS — C61 Malignant neoplasm of prostate: Secondary | ICD-10-CM | POA: Diagnosis not present

## 2019-02-10 DIAGNOSIS — C7951 Secondary malignant neoplasm of bone: Secondary | ICD-10-CM | POA: Diagnosis not present

## 2019-02-10 DIAGNOSIS — N390 Urinary tract infection, site not specified: Secondary | ICD-10-CM | POA: Diagnosis not present

## 2019-02-10 DIAGNOSIS — N401 Enlarged prostate with lower urinary tract symptoms: Secondary | ICD-10-CM | POA: Diagnosis not present

## 2019-02-10 DIAGNOSIS — C7800 Secondary malignant neoplasm of unspecified lung: Secondary | ICD-10-CM | POA: Diagnosis not present

## 2019-02-16 ENCOUNTER — Telehealth: Payer: Self-pay | Admitting: Cardiovascular Disease

## 2019-02-16 DIAGNOSIS — N401 Enlarged prostate with lower urinary tract symptoms: Secondary | ICD-10-CM | POA: Diagnosis not present

## 2019-02-16 DIAGNOSIS — C7951 Secondary malignant neoplasm of bone: Secondary | ICD-10-CM | POA: Diagnosis not present

## 2019-02-16 DIAGNOSIS — C7800 Secondary malignant neoplasm of unspecified lung: Secondary | ICD-10-CM | POA: Diagnosis not present

## 2019-02-16 DIAGNOSIS — C61 Malignant neoplasm of prostate: Secondary | ICD-10-CM | POA: Diagnosis not present

## 2019-02-16 DIAGNOSIS — N390 Urinary tract infection, site not specified: Secondary | ICD-10-CM | POA: Diagnosis not present

## 2019-02-16 DIAGNOSIS — E119 Type 2 diabetes mellitus without complications: Secondary | ICD-10-CM | POA: Diagnosis not present

## 2019-02-16 NOTE — Telephone Encounter (Signed)
Spoke with Cristela Blue and with Hospice and patients weight only up 1 pound but he is not eating as much. His bones are more visible per [facility] but abdomen swollen. Discussed with Janan Ridge PA and ok to give extra Lasix for 2 days. Recommendation given to [facility] however she stated per son patients weight not down in past with increased Lasix. Extra will be given tomorrow and ill forward to Dr Sallyanne Kuster for review.

## 2019-02-16 NOTE — Telephone Encounter (Signed)
New Message    Pt c/o swelling: STAT is pt has developed SOB within 24 hours  1) How much weight have you gained and in what time span? Gained 1 lbs. Has increased swelling and SOB and BP has been 160/81  2) If swelling, where is the swelling located? Abdomen and bilateral lower extremities   3) Are you currently taking a fluid pill? Yes, taking 2   4) Are you currently SOB? Yes   5) Do you have a log of your daily weights (if so, list)? Yes   6) Have you gained 3 pounds in a day or 5 pounds in a week? No  7) Have you traveled recently? No

## 2019-02-17 DIAGNOSIS — C7951 Secondary malignant neoplasm of bone: Secondary | ICD-10-CM | POA: Diagnosis not present

## 2019-02-17 DIAGNOSIS — C7800 Secondary malignant neoplasm of unspecified lung: Secondary | ICD-10-CM | POA: Diagnosis not present

## 2019-02-17 DIAGNOSIS — E119 Type 2 diabetes mellitus without complications: Secondary | ICD-10-CM | POA: Diagnosis not present

## 2019-02-17 DIAGNOSIS — N401 Enlarged prostate with lower urinary tract symptoms: Secondary | ICD-10-CM | POA: Diagnosis not present

## 2019-02-17 DIAGNOSIS — C61 Malignant neoplasm of prostate: Secondary | ICD-10-CM | POA: Diagnosis not present

## 2019-02-17 DIAGNOSIS — N390 Urinary tract infection, site not specified: Secondary | ICD-10-CM | POA: Diagnosis not present

## 2019-02-17 MED ORDER — METOLAZONE 2.5 MG PO TABS: ORAL_TABLET | ORAL | 3 refills | Status: AC

## 2019-02-17 NOTE — Telephone Encounter (Signed)
Reviewed recommendations with Maura. Left message for daughter to call back

## 2019-02-17 NOTE — Telephone Encounter (Signed)
Chart says he is taking furosemide 40 mg twice daily. My first recommendation would still be to try 80 mg twice daily. If no relief after 24 hours, give metolazone 2.5 mg once, 30 minutes before the AM furosemide dose. This can then be repeated as needed, but avoid doing it more than 3 times a week. MCr

## 2019-02-19 MED ORDER — FUROSEMIDE 40 MG PO TABS: 80.0000 mg | ORAL_TABLET | Freq: Two times a day (BID) | ORAL | 6 refills | Status: DC

## 2019-02-19 MED ORDER — FUROSEMIDE 80 MG PO TABS: 80.0000 mg | ORAL_TABLET | Freq: Two times a day (BID) | ORAL | 6 refills | Status: AC

## 2019-02-19 NOTE — Telephone Encounter (Signed)
Did clarify with Dr Sallyanne Kuster to increase Lasix to 80 mg twice a day from now on then use Metolazone 2.5 mg as needed up to 3 x a week. Geoffrey Bennett and daughter, verbalized understanding.

## 2019-02-21 DIAGNOSIS — E119 Type 2 diabetes mellitus without complications: Secondary | ICD-10-CM | POA: Diagnosis not present

## 2019-02-21 DIAGNOSIS — N390 Urinary tract infection, site not specified: Secondary | ICD-10-CM | POA: Diagnosis not present

## 2019-02-21 DIAGNOSIS — E785 Hyperlipidemia, unspecified: Secondary | ICD-10-CM | POA: Diagnosis not present

## 2019-02-21 DIAGNOSIS — H409 Unspecified glaucoma: Secondary | ICD-10-CM | POA: Diagnosis not present

## 2019-02-21 DIAGNOSIS — C7951 Secondary malignant neoplasm of bone: Secondary | ICD-10-CM | POA: Diagnosis not present

## 2019-02-21 DIAGNOSIS — C61 Malignant neoplasm of prostate: Secondary | ICD-10-CM | POA: Diagnosis not present

## 2019-02-21 DIAGNOSIS — C7800 Secondary malignant neoplasm of unspecified lung: Secondary | ICD-10-CM | POA: Diagnosis not present

## 2019-02-21 DIAGNOSIS — I4891 Unspecified atrial fibrillation: Secondary | ICD-10-CM | POA: Diagnosis not present

## 2019-02-21 DIAGNOSIS — I10 Essential (primary) hypertension: Secondary | ICD-10-CM | POA: Diagnosis not present

## 2019-02-21 DIAGNOSIS — N179 Acute kidney failure, unspecified: Secondary | ICD-10-CM | POA: Diagnosis not present

## 2019-02-21 DIAGNOSIS — N401 Enlarged prostate with lower urinary tract symptoms: Secondary | ICD-10-CM | POA: Diagnosis not present

## 2019-02-21 DIAGNOSIS — I214 Non-ST elevation (NSTEMI) myocardial infarction: Secondary | ICD-10-CM | POA: Diagnosis not present

## 2019-02-21 DIAGNOSIS — D649 Anemia, unspecified: Secondary | ICD-10-CM | POA: Diagnosis not present

## 2019-02-21 DIAGNOSIS — E43 Unspecified severe protein-calorie malnutrition: Secondary | ICD-10-CM | POA: Diagnosis not present

## 2019-02-23 DIAGNOSIS — C7800 Secondary malignant neoplasm of unspecified lung: Secondary | ICD-10-CM | POA: Diagnosis not present

## 2019-02-23 DIAGNOSIS — N390 Urinary tract infection, site not specified: Secondary | ICD-10-CM | POA: Diagnosis not present

## 2019-02-23 DIAGNOSIS — E119 Type 2 diabetes mellitus without complications: Secondary | ICD-10-CM | POA: Diagnosis not present

## 2019-02-23 DIAGNOSIS — C61 Malignant neoplasm of prostate: Secondary | ICD-10-CM | POA: Diagnosis not present

## 2019-02-23 DIAGNOSIS — N401 Enlarged prostate with lower urinary tract symptoms: Secondary | ICD-10-CM | POA: Diagnosis not present

## 2019-02-23 DIAGNOSIS — C7951 Secondary malignant neoplasm of bone: Secondary | ICD-10-CM | POA: Diagnosis not present

## 2019-02-24 DIAGNOSIS — N401 Enlarged prostate with lower urinary tract symptoms: Secondary | ICD-10-CM | POA: Diagnosis not present

## 2019-02-24 DIAGNOSIS — N390 Urinary tract infection, site not specified: Secondary | ICD-10-CM | POA: Diagnosis not present

## 2019-02-24 DIAGNOSIS — C7951 Secondary malignant neoplasm of bone: Secondary | ICD-10-CM | POA: Diagnosis not present

## 2019-02-24 DIAGNOSIS — C61 Malignant neoplasm of prostate: Secondary | ICD-10-CM | POA: Diagnosis not present

## 2019-02-24 DIAGNOSIS — C7800 Secondary malignant neoplasm of unspecified lung: Secondary | ICD-10-CM | POA: Diagnosis not present

## 2019-02-24 DIAGNOSIS — E119 Type 2 diabetes mellitus without complications: Secondary | ICD-10-CM | POA: Diagnosis not present

## 2019-02-25 DIAGNOSIS — N401 Enlarged prostate with lower urinary tract symptoms: Secondary | ICD-10-CM | POA: Diagnosis not present

## 2019-02-25 DIAGNOSIS — C7951 Secondary malignant neoplasm of bone: Secondary | ICD-10-CM | POA: Diagnosis not present

## 2019-02-25 DIAGNOSIS — C7800 Secondary malignant neoplasm of unspecified lung: Secondary | ICD-10-CM | POA: Diagnosis not present

## 2019-02-25 DIAGNOSIS — N390 Urinary tract infection, site not specified: Secondary | ICD-10-CM | POA: Diagnosis not present

## 2019-02-25 DIAGNOSIS — C61 Malignant neoplasm of prostate: Secondary | ICD-10-CM | POA: Diagnosis not present

## 2019-02-25 DIAGNOSIS — E119 Type 2 diabetes mellitus without complications: Secondary | ICD-10-CM | POA: Diagnosis not present

## 2019-02-27 DIAGNOSIS — E119 Type 2 diabetes mellitus without complications: Secondary | ICD-10-CM | POA: Diagnosis not present

## 2019-02-27 DIAGNOSIS — C7951 Secondary malignant neoplasm of bone: Secondary | ICD-10-CM | POA: Diagnosis not present

## 2019-02-27 DIAGNOSIS — C61 Malignant neoplasm of prostate: Secondary | ICD-10-CM | POA: Diagnosis not present

## 2019-02-27 DIAGNOSIS — N401 Enlarged prostate with lower urinary tract symptoms: Secondary | ICD-10-CM | POA: Diagnosis not present

## 2019-02-27 DIAGNOSIS — N390 Urinary tract infection, site not specified: Secondary | ICD-10-CM | POA: Diagnosis not present

## 2019-02-27 DIAGNOSIS — C7800 Secondary malignant neoplasm of unspecified lung: Secondary | ICD-10-CM | POA: Diagnosis not present

## 2019-03-02 DIAGNOSIS — C61 Malignant neoplasm of prostate: Secondary | ICD-10-CM | POA: Diagnosis not present

## 2019-03-02 DIAGNOSIS — N401 Enlarged prostate with lower urinary tract symptoms: Secondary | ICD-10-CM | POA: Diagnosis not present

## 2019-03-02 DIAGNOSIS — C7951 Secondary malignant neoplasm of bone: Secondary | ICD-10-CM | POA: Diagnosis not present

## 2019-03-02 DIAGNOSIS — N390 Urinary tract infection, site not specified: Secondary | ICD-10-CM | POA: Diagnosis not present

## 2019-03-02 DIAGNOSIS — E119 Type 2 diabetes mellitus without complications: Secondary | ICD-10-CM | POA: Diagnosis not present

## 2019-03-02 DIAGNOSIS — C7800 Secondary malignant neoplasm of unspecified lung: Secondary | ICD-10-CM | POA: Diagnosis not present

## 2019-03-03 DIAGNOSIS — C7800 Secondary malignant neoplasm of unspecified lung: Secondary | ICD-10-CM | POA: Diagnosis not present

## 2019-03-03 DIAGNOSIS — E119 Type 2 diabetes mellitus without complications: Secondary | ICD-10-CM | POA: Diagnosis not present

## 2019-03-03 DIAGNOSIS — C7951 Secondary malignant neoplasm of bone: Secondary | ICD-10-CM | POA: Diagnosis not present

## 2019-03-03 DIAGNOSIS — C61 Malignant neoplasm of prostate: Secondary | ICD-10-CM | POA: Diagnosis not present

## 2019-03-03 DIAGNOSIS — N401 Enlarged prostate with lower urinary tract symptoms: Secondary | ICD-10-CM | POA: Diagnosis not present

## 2019-03-03 DIAGNOSIS — N390 Urinary tract infection, site not specified: Secondary | ICD-10-CM | POA: Diagnosis not present

## 2019-03-04 DIAGNOSIS — N390 Urinary tract infection, site not specified: Secondary | ICD-10-CM | POA: Diagnosis not present

## 2019-03-04 DIAGNOSIS — C61 Malignant neoplasm of prostate: Secondary | ICD-10-CM | POA: Diagnosis not present

## 2019-03-04 DIAGNOSIS — C7800 Secondary malignant neoplasm of unspecified lung: Secondary | ICD-10-CM | POA: Diagnosis not present

## 2019-03-04 DIAGNOSIS — N401 Enlarged prostate with lower urinary tract symptoms: Secondary | ICD-10-CM | POA: Diagnosis not present

## 2019-03-04 DIAGNOSIS — E119 Type 2 diabetes mellitus without complications: Secondary | ICD-10-CM | POA: Diagnosis not present

## 2019-03-04 DIAGNOSIS — C7951 Secondary malignant neoplasm of bone: Secondary | ICD-10-CM | POA: Diagnosis not present

## 2019-03-05 DIAGNOSIS — N390 Urinary tract infection, site not specified: Secondary | ICD-10-CM | POA: Diagnosis not present

## 2019-03-05 DIAGNOSIS — C7800 Secondary malignant neoplasm of unspecified lung: Secondary | ICD-10-CM | POA: Diagnosis not present

## 2019-03-05 DIAGNOSIS — C61 Malignant neoplasm of prostate: Secondary | ICD-10-CM | POA: Diagnosis not present

## 2019-03-05 DIAGNOSIS — N401 Enlarged prostate with lower urinary tract symptoms: Secondary | ICD-10-CM | POA: Diagnosis not present

## 2019-03-05 DIAGNOSIS — E119 Type 2 diabetes mellitus without complications: Secondary | ICD-10-CM | POA: Diagnosis not present

## 2019-03-05 DIAGNOSIS — C7951 Secondary malignant neoplasm of bone: Secondary | ICD-10-CM | POA: Diagnosis not present

## 2019-03-09 DIAGNOSIS — C7951 Secondary malignant neoplasm of bone: Secondary | ICD-10-CM | POA: Diagnosis not present

## 2019-03-09 DIAGNOSIS — E119 Type 2 diabetes mellitus without complications: Secondary | ICD-10-CM | POA: Diagnosis not present

## 2019-03-09 DIAGNOSIS — C7800 Secondary malignant neoplasm of unspecified lung: Secondary | ICD-10-CM | POA: Diagnosis not present

## 2019-03-09 DIAGNOSIS — C61 Malignant neoplasm of prostate: Secondary | ICD-10-CM | POA: Diagnosis not present

## 2019-03-09 DIAGNOSIS — N390 Urinary tract infection, site not specified: Secondary | ICD-10-CM | POA: Diagnosis not present

## 2019-03-09 DIAGNOSIS — N401 Enlarged prostate with lower urinary tract symptoms: Secondary | ICD-10-CM | POA: Diagnosis not present

## 2019-03-10 DIAGNOSIS — N390 Urinary tract infection, site not specified: Secondary | ICD-10-CM | POA: Diagnosis not present

## 2019-03-10 DIAGNOSIS — E119 Type 2 diabetes mellitus without complications: Secondary | ICD-10-CM | POA: Diagnosis not present

## 2019-03-10 DIAGNOSIS — C7951 Secondary malignant neoplasm of bone: Secondary | ICD-10-CM | POA: Diagnosis not present

## 2019-03-10 DIAGNOSIS — N401 Enlarged prostate with lower urinary tract symptoms: Secondary | ICD-10-CM | POA: Diagnosis not present

## 2019-03-10 DIAGNOSIS — C61 Malignant neoplasm of prostate: Secondary | ICD-10-CM | POA: Diagnosis not present

## 2019-03-10 DIAGNOSIS — C7800 Secondary malignant neoplasm of unspecified lung: Secondary | ICD-10-CM | POA: Diagnosis not present

## 2019-03-11 ENCOUNTER — Encounter

## 2019-03-12 DIAGNOSIS — C7951 Secondary malignant neoplasm of bone: Secondary | ICD-10-CM | POA: Diagnosis not present

## 2019-03-12 DIAGNOSIS — C7800 Secondary malignant neoplasm of unspecified lung: Secondary | ICD-10-CM | POA: Diagnosis not present

## 2019-03-12 DIAGNOSIS — E119 Type 2 diabetes mellitus without complications: Secondary | ICD-10-CM | POA: Diagnosis not present

## 2019-03-12 DIAGNOSIS — C61 Malignant neoplasm of prostate: Secondary | ICD-10-CM | POA: Diagnosis not present

## 2019-03-12 DIAGNOSIS — N390 Urinary tract infection, site not specified: Secondary | ICD-10-CM | POA: Diagnosis not present

## 2019-03-12 DIAGNOSIS — N401 Enlarged prostate with lower urinary tract symptoms: Secondary | ICD-10-CM | POA: Diagnosis not present

## 2019-03-15 DIAGNOSIS — N390 Urinary tract infection, site not specified: Secondary | ICD-10-CM | POA: Diagnosis not present

## 2019-03-15 DIAGNOSIS — C7951 Secondary malignant neoplasm of bone: Secondary | ICD-10-CM | POA: Diagnosis not present

## 2019-03-15 DIAGNOSIS — N401 Enlarged prostate with lower urinary tract symptoms: Secondary | ICD-10-CM | POA: Diagnosis not present

## 2019-03-15 DIAGNOSIS — C7800 Secondary malignant neoplasm of unspecified lung: Secondary | ICD-10-CM | POA: Diagnosis not present

## 2019-03-15 DIAGNOSIS — C61 Malignant neoplasm of prostate: Secondary | ICD-10-CM | POA: Diagnosis not present

## 2019-03-15 DIAGNOSIS — E119 Type 2 diabetes mellitus without complications: Secondary | ICD-10-CM | POA: Diagnosis not present

## 2019-03-16 DIAGNOSIS — C61 Malignant neoplasm of prostate: Secondary | ICD-10-CM | POA: Diagnosis not present

## 2019-03-16 DIAGNOSIS — N390 Urinary tract infection, site not specified: Secondary | ICD-10-CM | POA: Diagnosis not present

## 2019-03-16 DIAGNOSIS — C7951 Secondary malignant neoplasm of bone: Secondary | ICD-10-CM | POA: Diagnosis not present

## 2019-03-16 DIAGNOSIS — C7800 Secondary malignant neoplasm of unspecified lung: Secondary | ICD-10-CM | POA: Diagnosis not present

## 2019-03-16 DIAGNOSIS — E119 Type 2 diabetes mellitus without complications: Secondary | ICD-10-CM | POA: Diagnosis not present

## 2019-03-16 DIAGNOSIS — N401 Enlarged prostate with lower urinary tract symptoms: Secondary | ICD-10-CM | POA: Diagnosis not present

## 2019-03-18 DIAGNOSIS — E119 Type 2 diabetes mellitus without complications: Secondary | ICD-10-CM | POA: Diagnosis not present

## 2019-03-18 DIAGNOSIS — C7800 Secondary malignant neoplasm of unspecified lung: Secondary | ICD-10-CM | POA: Diagnosis not present

## 2019-03-18 DIAGNOSIS — C61 Malignant neoplasm of prostate: Secondary | ICD-10-CM | POA: Diagnosis not present

## 2019-03-18 DIAGNOSIS — N401 Enlarged prostate with lower urinary tract symptoms: Secondary | ICD-10-CM | POA: Diagnosis not present

## 2019-03-18 DIAGNOSIS — N390 Urinary tract infection, site not specified: Secondary | ICD-10-CM | POA: Diagnosis not present

## 2019-03-18 DIAGNOSIS — C7951 Secondary malignant neoplasm of bone: Secondary | ICD-10-CM | POA: Diagnosis not present

## 2019-03-21 DIAGNOSIS — C7951 Secondary malignant neoplasm of bone: Secondary | ICD-10-CM | POA: Diagnosis not present

## 2019-03-21 DIAGNOSIS — N390 Urinary tract infection, site not specified: Secondary | ICD-10-CM | POA: Diagnosis not present

## 2019-03-21 DIAGNOSIS — N401 Enlarged prostate with lower urinary tract symptoms: Secondary | ICD-10-CM | POA: Diagnosis not present

## 2019-03-21 DIAGNOSIS — E119 Type 2 diabetes mellitus without complications: Secondary | ICD-10-CM | POA: Diagnosis not present

## 2019-03-21 DIAGNOSIS — C61 Malignant neoplasm of prostate: Secondary | ICD-10-CM | POA: Diagnosis not present

## 2019-03-21 DIAGNOSIS — C7800 Secondary malignant neoplasm of unspecified lung: Secondary | ICD-10-CM | POA: Diagnosis not present

## 2019-03-24 DEATH — deceased

## 2019-11-01 IMAGING — NM NM BONE WHOLE BODY
2 series · 2 of 2 positions shown · non-contrast
Comparison: Bone scan 05/21/2017.  CT

CLINICAL DATA: History of prostate cancer.  Elevated PSA.

EXAM:
NUCLEAR MEDICINE WHOLE BODY BONE SCAN
TECHNIQUE: Whole body anterior and posterior images were obtained approximately
3 hours after intravenous injection of radiopharmaceutical.
RADIOPHARMACEUTICALS:  21.3 mCi Yechnetium-SSm MDP IV

[Series 1: wbr_bone_40 whole body · 2.66mm/px · 1 of 1 slices shown (1 of 2)]
[im 1/1]
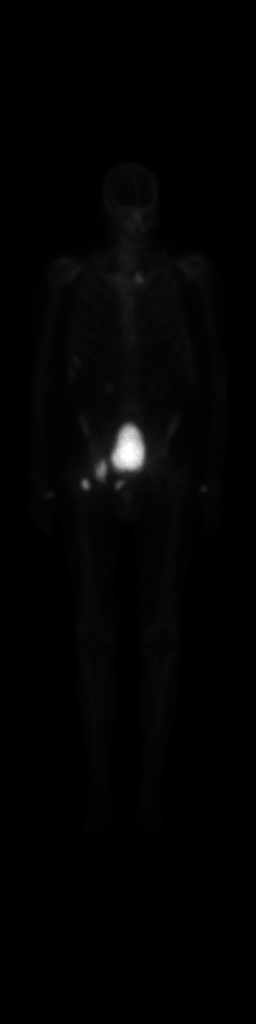

[Series 1: wbr_bone_40 whole body · 2.66mm/px · 1 of 1 slices shown (2 of 2)]
[im 1/1]
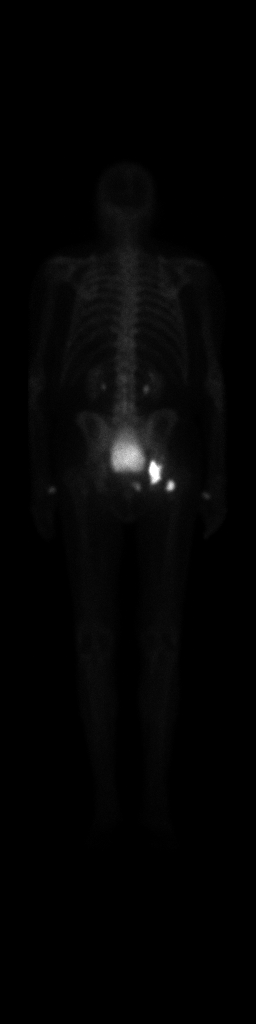

[2 of 2 positions shown; findings below may reference images not displayed]

FINDINGS: Bilateral renal function excretion. Intense increased activity noted
about the right ischium and proximal right femur. Focal new area of
increased activity over the right pubis. Metastatic disease could
present this fashion . Posttraumatic change could also present this
fashion and plain film evaluation of the pelvis and right hip
suggested. Stable increased activity noted about the
sternoclavicular joints, particular on the left. These changes most
likely degenerative. Bilateral knee replacements.
IMPRESSION: Intense increased activity noted the right ischium and proximal
right femur. Focal new area of increased activity noted over the
right pubis. Further evaluation with plain film studies of the
pelvis the right hip suggested.

## 2020-09-28 IMAGING — DX DG RIBS W/ CHEST 3+V*R*
2 series · 3 of 3 positions shown · non-contrast
Comparison: Chest x-ray October 25, 2014

CLINICAL DATA: Fall 2 days ago.  Pain to lower ribs.

EXAM:
RIGHT RIBS AND CHEST - 3+ VIEW

[chest]
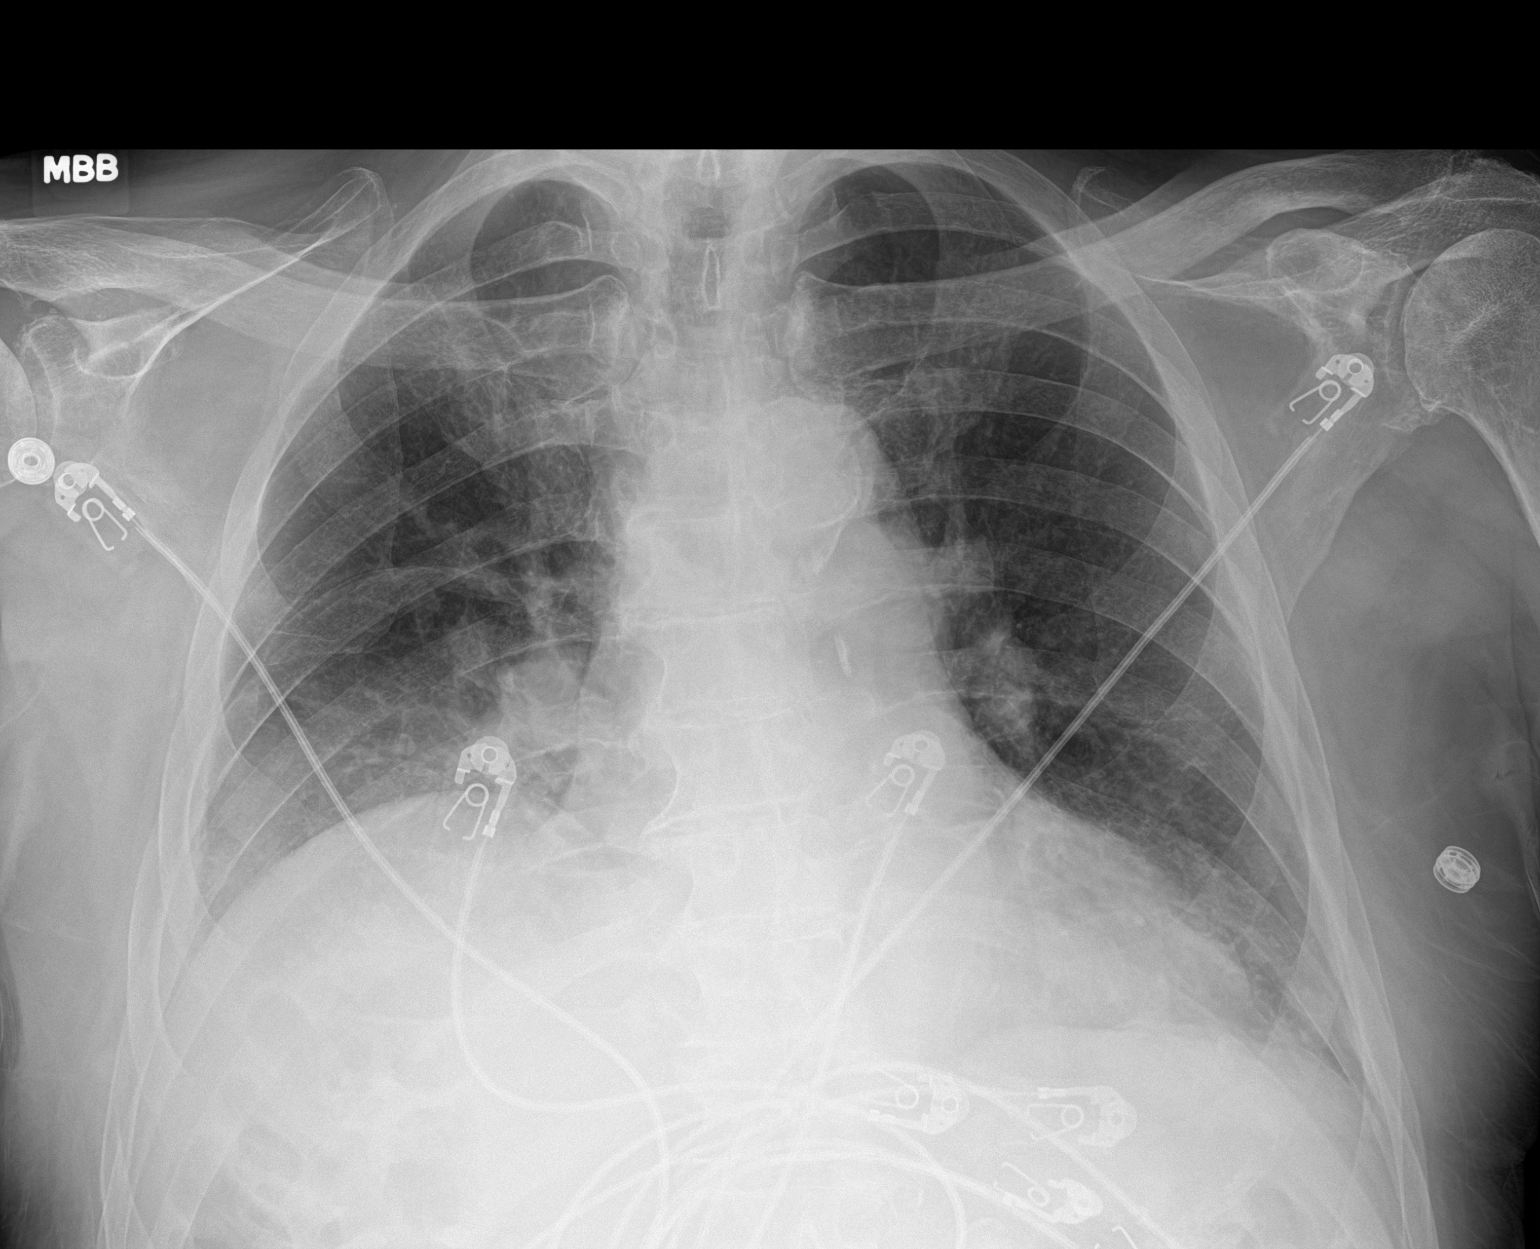

[Series 2: rib · 0.14mm/px · 2 of 2 slices shown]
[im 1/2]
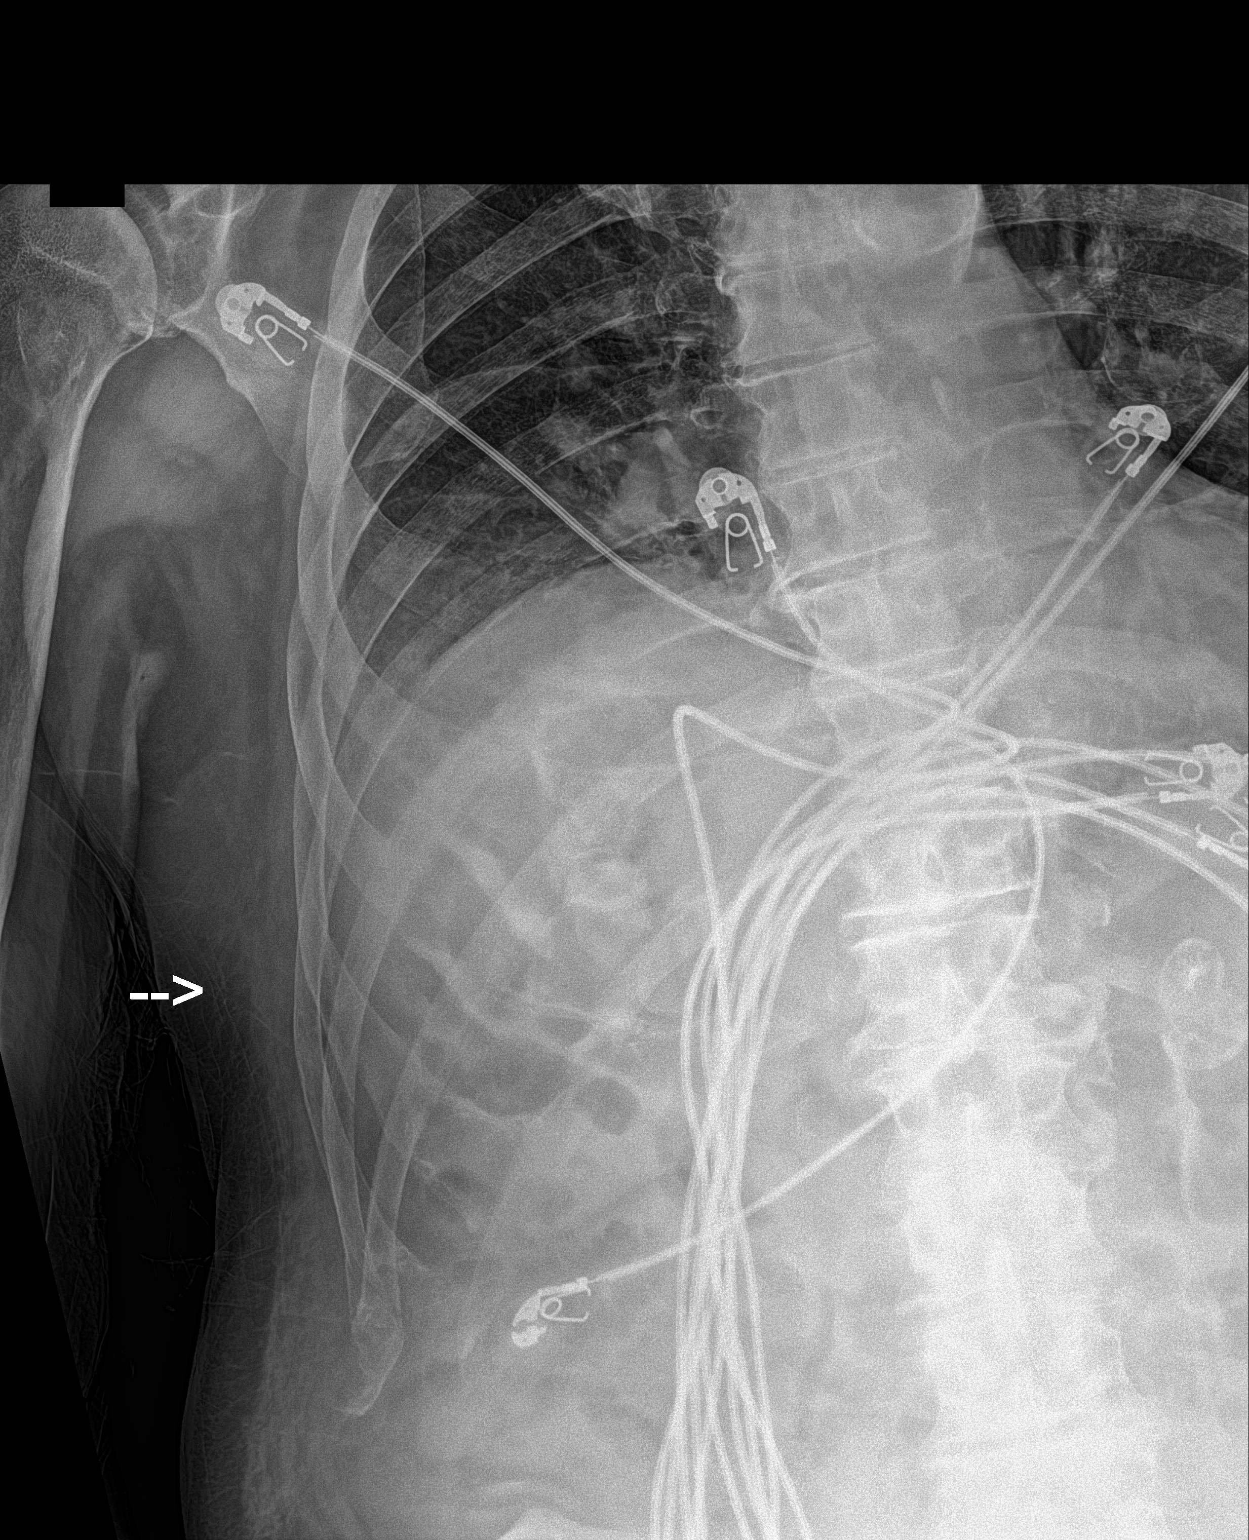
[im 2/2]
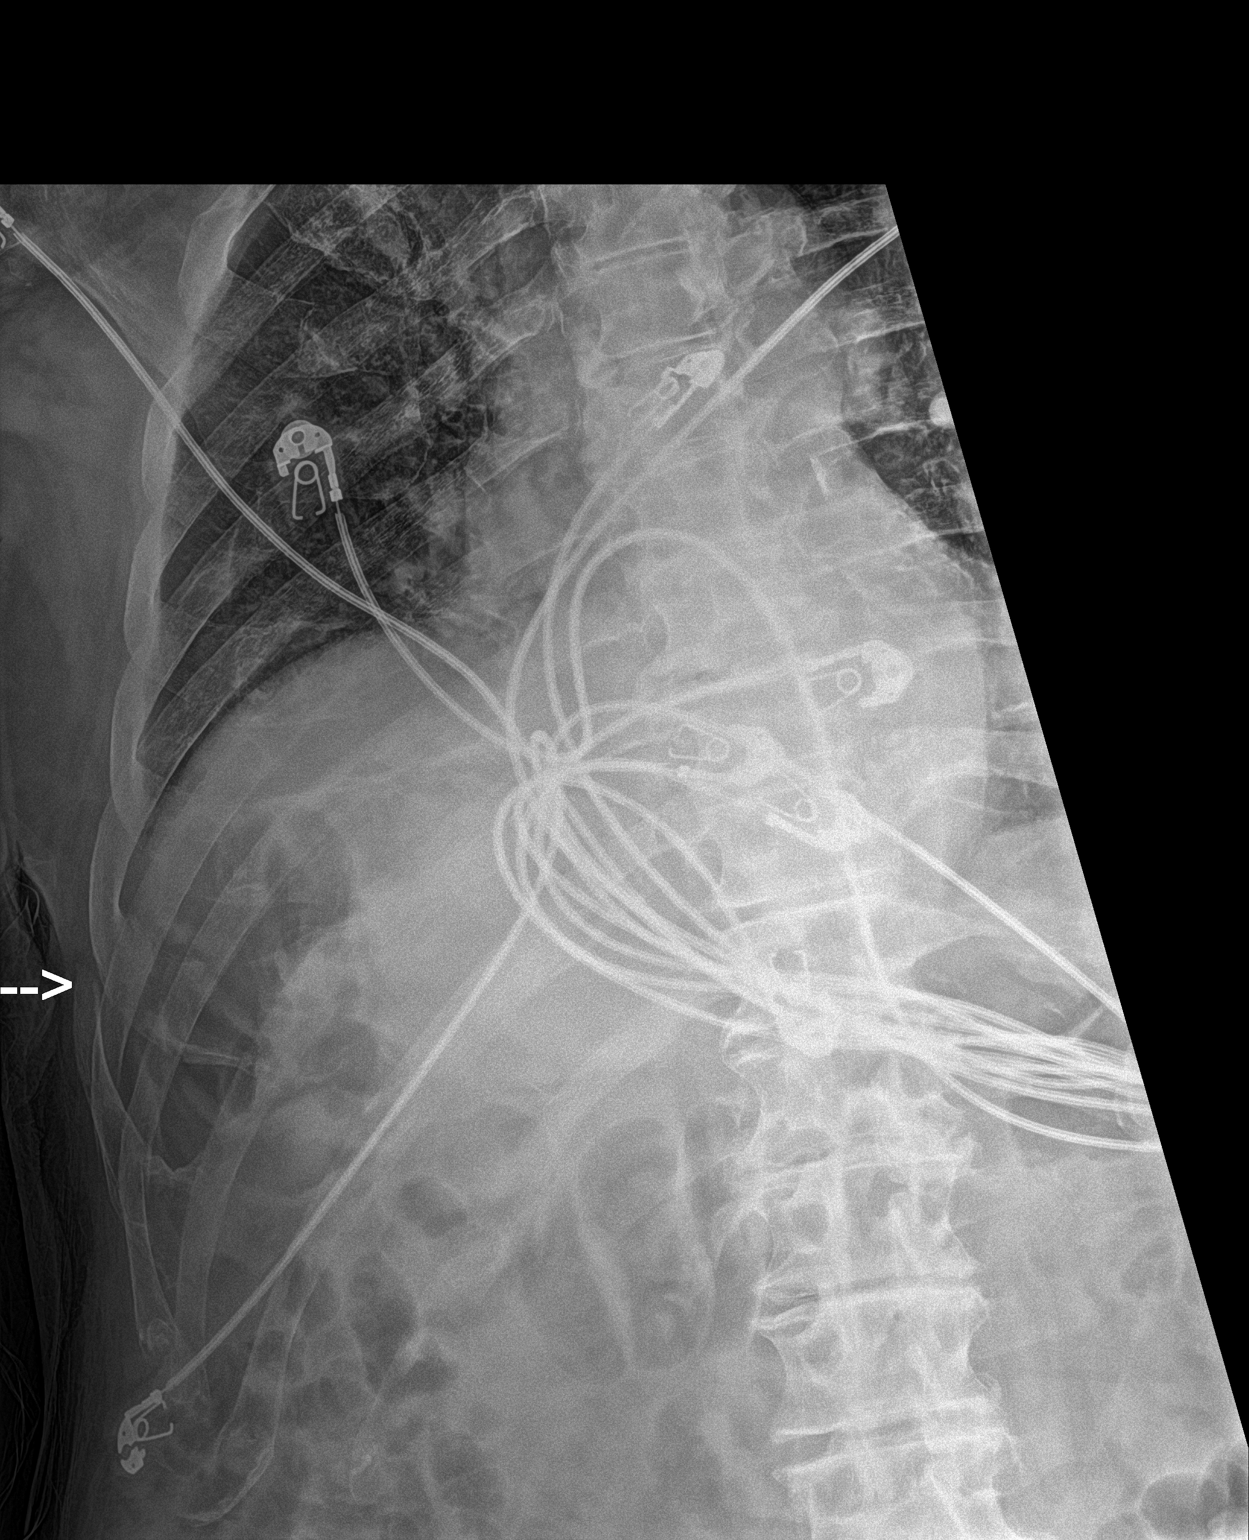

[3 of 3 positions shown; findings below may reference images not displayed]

FINDINGS: Elevation right hemidiaphragm persists. The heart, hila, and
mediastinum are unchanged. Mild opacity in the bases, right greater
than left. No overt edema. No other acute abnormalities. Apparent
lateral right rib fracture at the site of pain.
IMPRESSION: Right-sided rib fracture without pneumothorax.

Right greater than left bibasilar opacities favored represent
atelectasis rather than infiltrate.
